# Patient Record
Sex: Female | Born: 1940 | Race: White | Hispanic: No | Marital: Married | Smoking: Never smoker
Health system: Southern US, Community
[De-identification: ages and names within clinical notes are randomized; demographics above are authoritative.]

## PROBLEM LIST (undated history)

## (undated) DIAGNOSIS — C50919 Malignant neoplasm of unspecified site of unspecified female breast: Secondary | ICD-10-CM

## (undated) DIAGNOSIS — I10 Essential (primary) hypertension: Secondary | ICD-10-CM

## (undated) DIAGNOSIS — M5126 Other intervertebral disc displacement, lumbar region: Secondary | ICD-10-CM

## (undated) DIAGNOSIS — M810 Age-related osteoporosis without current pathological fracture: Secondary | ICD-10-CM

## (undated) DIAGNOSIS — K8020 Calculus of gallbladder without cholecystitis without obstruction: Secondary | ICD-10-CM

## (undated) DIAGNOSIS — H269 Unspecified cataract: Secondary | ICD-10-CM

## (undated) DIAGNOSIS — T7840XA Allergy, unspecified, initial encounter: Secondary | ICD-10-CM

## (undated) DIAGNOSIS — L723 Sebaceous cyst: Secondary | ICD-10-CM

## (undated) DIAGNOSIS — G5601 Carpal tunnel syndrome, right upper limb: Secondary | ICD-10-CM

## (undated) DIAGNOSIS — K219 Gastro-esophageal reflux disease without esophagitis: Secondary | ICD-10-CM

## (undated) DIAGNOSIS — E785 Hyperlipidemia, unspecified: Secondary | ICD-10-CM

## (undated) HISTORY — DX: Other intervertebral disc displacement, lumbar region: M51.26

## (undated) HISTORY — DX: Hyperlipidemia, unspecified: E78.5

## (undated) HISTORY — DX: Malignant neoplasm of unspecified site of unspecified female breast: C50.919

## (undated) HISTORY — DX: Carpal tunnel syndrome, right upper limb: G56.01

## (undated) HISTORY — DX: Sebaceous cyst: L72.3

## (undated) HISTORY — DX: Calculus of gallbladder without cholecystitis without obstruction: K80.20

## (undated) HISTORY — DX: Unspecified cataract: H26.9

## (undated) HISTORY — DX: Essential (primary) hypertension: I10

## (undated) HISTORY — DX: Age-related osteoporosis without current pathological fracture: M81.0

## (undated) HISTORY — DX: Allergy, unspecified, initial encounter: T78.40XA

## (undated) HISTORY — PX: COLONOSCOPY: SHX174

## (undated) HISTORY — DX: Gastro-esophageal reflux disease without esophagitis: K21.9

---

## 1968-06-06 DIAGNOSIS — K8020 Calculus of gallbladder without cholecystitis without obstruction: Secondary | ICD-10-CM

## 1968-06-06 HISTORY — DX: Calculus of gallbladder without cholecystitis without obstruction: K80.20

## 1968-06-06 HISTORY — PX: CHOLECYSTECTOMY: SHX55

## 1968-06-06 HISTORY — PX: APPENDECTOMY: SHX54

## 1968-06-06 HISTORY — PX: TUBAL LIGATION: SHX77

## 1998-02-26 ENCOUNTER — Other Ambulatory Visit: Admission: RE | Admit: 1998-02-26 | Discharge: 1998-02-26 | Payer: Self-pay | Admitting: Obstetrics & Gynecology

## 1999-03-01 ENCOUNTER — Other Ambulatory Visit: Admission: RE | Admit: 1999-03-01 | Discharge: 1999-03-01 | Payer: Self-pay | Admitting: Obstetrics & Gynecology

## 2000-04-20 ENCOUNTER — Other Ambulatory Visit: Admission: RE | Admit: 2000-04-20 | Discharge: 2000-04-20 | Payer: Self-pay | Admitting: Obstetrics & Gynecology

## 2000-06-30 ENCOUNTER — Encounter (INDEPENDENT_AMBULATORY_CARE_PROVIDER_SITE_OTHER): Payer: Self-pay

## 2000-06-30 ENCOUNTER — Ambulatory Visit (HOSPITAL_COMMUNITY): Admission: RE | Admit: 2000-06-30 | Discharge: 2000-06-30 | Payer: Self-pay | Admitting: Obstetrics & Gynecology

## 2001-04-24 ENCOUNTER — Other Ambulatory Visit: Admission: RE | Admit: 2001-04-24 | Discharge: 2001-04-24 | Payer: Self-pay | Admitting: Obstetrics & Gynecology

## 2002-05-06 ENCOUNTER — Other Ambulatory Visit: Admission: RE | Admit: 2002-05-06 | Discharge: 2002-05-06 | Payer: Self-pay | Admitting: Obstetrics & Gynecology

## 2003-05-19 ENCOUNTER — Other Ambulatory Visit: Admission: RE | Admit: 2003-05-19 | Discharge: 2003-05-19 | Payer: Self-pay | Admitting: Obstetrics & Gynecology

## 2004-06-11 ENCOUNTER — Other Ambulatory Visit: Admission: RE | Admit: 2004-06-11 | Discharge: 2004-06-11 | Payer: Self-pay | Admitting: Obstetrics & Gynecology

## 2005-07-13 ENCOUNTER — Other Ambulatory Visit: Admission: RE | Admit: 2005-07-13 | Discharge: 2005-07-13 | Payer: Self-pay | Admitting: Obstetrics & Gynecology

## 2005-09-14 ENCOUNTER — Encounter: Admission: RE | Admit: 2005-09-14 | Discharge: 2005-09-14 | Payer: Self-pay | Admitting: *Deleted

## 2005-09-23 ENCOUNTER — Encounter: Admission: RE | Admit: 2005-09-23 | Discharge: 2005-09-23 | Payer: Self-pay | Admitting: General Surgery

## 2005-09-27 ENCOUNTER — Encounter (INDEPENDENT_AMBULATORY_CARE_PROVIDER_SITE_OTHER): Payer: Self-pay | Admitting: *Deleted

## 2005-09-27 ENCOUNTER — Ambulatory Visit (HOSPITAL_BASED_OUTPATIENT_CLINIC_OR_DEPARTMENT_OTHER): Admission: RE | Admit: 2005-09-27 | Discharge: 2005-09-27 | Payer: Self-pay | Admitting: General Surgery

## 2005-09-27 DIAGNOSIS — C50919 Malignant neoplasm of unspecified site of unspecified female breast: Secondary | ICD-10-CM

## 2005-09-27 HISTORY — DX: Malignant neoplasm of unspecified site of unspecified female breast: C50.919

## 2005-09-27 HISTORY — PX: BREAST LUMPECTOMY: SHX2

## 2005-10-03 ENCOUNTER — Ambulatory Visit: Payer: Self-pay | Admitting: Oncology

## 2005-10-05 LAB — COMPREHENSIVE METABOLIC PANEL
AST: 19 U/L (ref 0–37)
Alkaline Phosphatase: 53 U/L (ref 39–117)
Glucose, Bld: 84 mg/dL (ref 70–99)
Sodium: 135 mEq/L (ref 135–145)
Total Bilirubin: 0.8 mg/dL (ref 0.3–1.2)
Total Protein: 6.9 g/dL (ref 6.0–8.3)

## 2005-10-05 LAB — CBC WITH DIFFERENTIAL/PLATELET
Basophils Absolute: 0 10*3/uL (ref 0.0–0.1)
Eosinophils Absolute: 0.1 10*3/uL (ref 0.0–0.5)
HCT: 38 % (ref 34.8–46.6)
HGB: 13.3 g/dL (ref 11.6–15.9)
MONO#: 0.5 10*3/uL (ref 0.1–0.9)
NEUT#: 2.6 10*3/uL (ref 1.5–6.5)
NEUT%: 57.9 % (ref 39.6–76.8)
RDW: 12.5 % (ref 11.3–14.5)
lymph#: 1.3 10*3/uL (ref 0.9–3.3)

## 2005-10-05 LAB — CANCER ANTIGEN 27.29: CA 27.29: 27 U/mL (ref 0–39)

## 2005-10-10 ENCOUNTER — Encounter (INDEPENDENT_AMBULATORY_CARE_PROVIDER_SITE_OTHER): Payer: Self-pay | Admitting: Specialist

## 2005-10-10 ENCOUNTER — Ambulatory Visit (HOSPITAL_COMMUNITY): Admission: RE | Admit: 2005-10-10 | Discharge: 2005-10-10 | Payer: Self-pay | Admitting: General Surgery

## 2005-10-10 HISTORY — PX: BREAST MASS EXCISION: SHX1267

## 2005-10-17 ENCOUNTER — Ambulatory Visit: Admission: RE | Admit: 2005-10-17 | Discharge: 2005-12-29 | Payer: Self-pay | Admitting: *Deleted

## 2005-11-04 ENCOUNTER — Ambulatory Visit: Payer: Self-pay | Admitting: Oncology

## 2005-11-23 ENCOUNTER — Ambulatory Visit (HOSPITAL_COMMUNITY): Admission: RE | Admit: 2005-11-23 | Discharge: 2005-11-23 | Payer: Self-pay | Admitting: Oncology

## 2005-11-30 LAB — COMPREHENSIVE METABOLIC PANEL
Alkaline Phosphatase: 50 U/L (ref 39–117)
CO2: 28 mEq/L (ref 19–32)
Creatinine, Ser: 0.8 mg/dL (ref 0.40–1.20)
Glucose, Bld: 105 mg/dL — ABNORMAL HIGH (ref 70–99)
Sodium: 136 mEq/L (ref 135–145)
Total Bilirubin: 0.6 mg/dL (ref 0.3–1.2)

## 2005-11-30 LAB — CANCER ANTIGEN 27.29: CA 27.29: 29 U/mL (ref 0–39)

## 2005-11-30 LAB — CBC WITH DIFFERENTIAL/PLATELET
BASO%: 0.6 % (ref 0.0–2.0)
Eosinophils Absolute: 0.1 10*3/uL (ref 0.0–0.5)
HCT: 37 % (ref 34.8–46.6)
LYMPH%: 22.5 % (ref 14.0–48.0)
MCHC: 35 g/dL (ref 32.0–36.0)
MCV: 87.2 fL (ref 81.0–101.0)
MONO#: 0.3 10*3/uL (ref 0.1–0.9)
MONO%: 7.2 % (ref 0.0–13.0)
NEUT%: 68.4 % (ref 39.6–76.8)
Platelets: 265 10*3/uL (ref 145–400)
RBC: 4.24 10*6/uL (ref 3.70–5.32)
WBC: 4.7 10*3/uL (ref 3.9–10.0)

## 2005-11-30 LAB — LACTATE DEHYDROGENASE: LDH: 131 U/L (ref 94–250)

## 2006-03-13 ENCOUNTER — Ambulatory Visit: Payer: Self-pay | Admitting: Oncology

## 2006-03-15 LAB — CBC WITH DIFFERENTIAL/PLATELET
Basophils Absolute: 0 10*3/uL (ref 0.0–0.1)
Eosinophils Absolute: 0 10*3/uL (ref 0.0–0.5)
HGB: 12.8 g/dL (ref 11.6–15.9)
MCV: 88.9 fL (ref 81.0–101.0)
MONO#: 0.4 10*3/uL (ref 0.1–0.9)
MONO%: 6.8 % (ref 0.0–13.0)
NEUT#: 4.3 10*3/uL (ref 1.5–6.5)
RDW: 12.6 % (ref 11.3–14.5)
WBC: 5.9 10*3/uL (ref 3.9–10.0)

## 2006-03-15 LAB — COMPREHENSIVE METABOLIC PANEL
Albumin: 4.1 g/dL (ref 3.5–5.2)
Alkaline Phosphatase: 50 U/L (ref 39–117)
BUN: 13 mg/dL (ref 6–23)
CO2: 26 mEq/L (ref 19–32)
Calcium: 8.9 mg/dL (ref 8.4–10.5)
Chloride: 103 mEq/L (ref 96–112)
Glucose, Bld: 87 mg/dL (ref 70–99)
Potassium: 3.9 mEq/L (ref 3.5–5.3)
Total Protein: 6.9 g/dL (ref 6.0–8.3)

## 2006-03-15 LAB — CANCER ANTIGEN 27.29: CA 27.29: 26 U/mL (ref 0–39)

## 2006-06-06 DIAGNOSIS — M51369 Other intervertebral disc degeneration, lumbar region without mention of lumbar back pain or lower extremity pain: Secondary | ICD-10-CM

## 2006-06-06 DIAGNOSIS — M5126 Other intervertebral disc displacement, lumbar region: Secondary | ICD-10-CM

## 2006-06-06 HISTORY — DX: Other intervertebral disc degeneration, lumbar region without mention of lumbar back pain or lower extremity pain: M51.369

## 2006-06-06 HISTORY — DX: Other intervertebral disc displacement, lumbar region: M51.26

## 2006-07-07 ENCOUNTER — Ambulatory Visit: Payer: Self-pay | Admitting: Oncology

## 2006-07-12 LAB — CBC WITH DIFFERENTIAL/PLATELET
BASO%: 0.3 % (ref 0.0–2.0)
Eosinophils Absolute: 0.1 10*3/uL (ref 0.0–0.5)
HCT: 36.3 % (ref 34.8–46.6)
LYMPH%: 24.7 % (ref 14.0–48.0)
MCHC: 35.6 g/dL (ref 32.0–36.0)
MONO#: 0.4 10*3/uL (ref 0.1–0.9)
NEUT%: 66.1 % (ref 39.6–76.8)
Platelets: 226 10*3/uL (ref 145–400)
WBC: 5.2 10*3/uL (ref 3.9–10.0)

## 2006-07-12 LAB — COMPREHENSIVE METABOLIC PANEL
ALT: 9 U/L (ref 0–35)
AST: 17 U/L (ref 0–37)
CO2: 27 mEq/L (ref 19–32)
Calcium: 8.7 mg/dL (ref 8.4–10.5)
Chloride: 103 mEq/L (ref 96–112)
Sodium: 138 mEq/L (ref 135–145)
Total Bilirubin: 0.6 mg/dL (ref 0.3–1.2)
Total Protein: 6.7 g/dL (ref 6.0–8.3)

## 2006-07-12 LAB — LACTATE DEHYDROGENASE: LDH: 140 U/L (ref 94–250)

## 2006-09-20 ENCOUNTER — Ambulatory Visit: Payer: Self-pay | Admitting: Internal Medicine

## 2006-09-25 ENCOUNTER — Ambulatory Visit: Payer: Self-pay | Admitting: Internal Medicine

## 2006-09-27 ENCOUNTER — Ambulatory Visit: Payer: Self-pay | Admitting: Internal Medicine

## 2006-11-03 ENCOUNTER — Ambulatory Visit: Payer: Self-pay | Admitting: Internal Medicine

## 2006-11-07 ENCOUNTER — Ambulatory Visit: Payer: Self-pay | Admitting: Oncology

## 2006-11-09 LAB — COMPREHENSIVE METABOLIC PANEL
ALT: 13 U/L (ref 0–35)
AST: 20 U/L (ref 0–37)
Alkaline Phosphatase: 54 U/L (ref 39–117)
BUN: 14 mg/dL (ref 6–23)
Calcium: 8.9 mg/dL (ref 8.4–10.5)
Chloride: 99 mEq/L (ref 96–112)
Creatinine, Ser: 0.66 mg/dL (ref 0.40–1.20)
Total Bilirubin: 0.7 mg/dL (ref 0.3–1.2)

## 2006-11-09 LAB — CBC WITH DIFFERENTIAL/PLATELET
BASO%: 0.4 % (ref 0.0–2.0)
EOS%: 1.2 % (ref 0.0–7.0)
HCT: 38 % (ref 34.8–46.6)
MCH: 30.9 pg (ref 26.0–34.0)
MCHC: 35.7 g/dL (ref 32.0–36.0)
MCV: 86.4 fL (ref 81.0–101.0)
MONO%: 8.6 % (ref 0.0–13.0)
NEUT%: 64.5 % (ref 39.6–76.8)
lymph#: 1 10*3/uL (ref 0.9–3.3)

## 2007-03-15 ENCOUNTER — Ambulatory Visit: Payer: Self-pay | Admitting: Oncology

## 2007-03-19 LAB — CBC WITH DIFFERENTIAL/PLATELET
BASO%: 0.3 % (ref 0.0–2.0)
EOS%: 0.9 % (ref 0.0–7.0)
HCT: 35.1 % (ref 34.8–46.6)
LYMPH%: 34.4 % (ref 14.0–48.0)
MCH: 31 pg (ref 26.0–34.0)
MCHC: 35.2 g/dL (ref 32.0–36.0)
MCV: 88.1 fL (ref 81.0–101.0)
MONO#: 0.5 10*3/uL (ref 0.1–0.9)
NEUT%: 57 % (ref 39.6–76.8)
Platelets: 285 10*3/uL (ref 145–400)

## 2007-03-19 LAB — COMPREHENSIVE METABOLIC PANEL
ALT: 16 U/L (ref 0–35)
CO2: 26 mEq/L (ref 19–32)
Creatinine, Ser: 0.72 mg/dL (ref 0.40–1.20)
Total Bilirubin: 0.4 mg/dL (ref 0.3–1.2)

## 2007-03-19 LAB — CANCER ANTIGEN 27.29: CA 27.29: 28 U/mL (ref 0–39)

## 2007-03-28 LAB — ESTRADIOL, ULTRA SENS: Estradiol, Ultra Sensitive: <2 pg/mL

## 2007-03-30 ENCOUNTER — Encounter: Admission: RE | Admit: 2007-03-30 | Discharge: 2007-03-30 | Payer: Self-pay | Admitting: Family Medicine

## 2007-09-12 ENCOUNTER — Ambulatory Visit: Payer: Self-pay | Admitting: Oncology

## 2007-09-17 LAB — CBC WITH DIFFERENTIAL/PLATELET
Basophils Absolute: 0 10*3/uL (ref 0.0–0.1)
Eosinophils Absolute: 0 10*3/uL (ref 0.0–0.5)
HCT: 36.1 % (ref 34.8–46.6)
LYMPH%: 26.8 % (ref 14.0–48.0)
MONO#: 0.3 10*3/uL (ref 0.1–0.9)
NEUT#: 3.7 10*3/uL (ref 1.5–6.5)
NEUT%: 66.3 % (ref 39.6–76.8)
Platelets: 248 10*3/uL (ref 145–400)
WBC: 5.6 10*3/uL (ref 3.9–10.0)

## 2007-09-18 LAB — VITAMIN D 25 HYDROXY (VIT D DEFICIENCY, FRACTURES): Vit D, 25-Hydroxy: 52 ng/mL (ref 30–89)

## 2007-09-18 LAB — COMPREHENSIVE METABOLIC PANEL
CO2: 25 mEq/L (ref 19–32)
Creatinine, Ser: 0.75 mg/dL (ref 0.40–1.20)
Glucose, Bld: 108 mg/dL — ABNORMAL HIGH (ref 70–99)
Total Bilirubin: 0.4 mg/dL (ref 0.3–1.2)

## 2007-09-18 LAB — CANCER ANTIGEN 27.29: CA 27.29: 31 U/mL (ref 0–39)

## 2007-09-18 LAB — LACTATE DEHYDROGENASE: LDH: 145 U/L (ref 94–250)

## 2007-09-20 ENCOUNTER — Encounter: Admission: RE | Admit: 2007-09-20 | Discharge: 2007-09-20 | Payer: Self-pay | Admitting: Oncology

## 2007-10-02 ENCOUNTER — Encounter: Admission: RE | Admit: 2007-10-02 | Discharge: 2007-10-02 | Payer: Self-pay | Admitting: Neurosurgery

## 2007-11-05 DIAGNOSIS — Z853 Personal history of malignant neoplasm of breast: Secondary | ICD-10-CM

## 2008-03-21 ENCOUNTER — Ambulatory Visit: Payer: Self-pay | Admitting: Oncology

## 2008-03-25 LAB — CBC WITH DIFFERENTIAL/PLATELET
Basophils Absolute: 0 10*3/uL (ref 0.0–0.1)
EOS%: 1.5 % (ref 0.0–7.0)
HCT: 39 % (ref 34.8–46.6)
HGB: 13.7 g/dL (ref 11.6–15.9)
LYMPH%: 22.7 % (ref 14.0–48.0)
MCH: 30.9 pg (ref 26.0–34.0)
MCV: 87.8 fL (ref 81.0–101.0)
MONO%: 6.6 % (ref 0.0–13.0)
NEUT%: 68.8 % (ref 39.6–76.8)
Platelets: 279 10*3/uL (ref 145–400)

## 2008-03-26 LAB — COMPREHENSIVE METABOLIC PANEL
AST: 19 U/L (ref 0–37)
Alkaline Phosphatase: 73 U/L (ref 39–117)
BUN: 14 mg/dL (ref 6–23)
Calcium: 9.7 mg/dL (ref 8.4–10.5)
Creatinine, Ser: 0.75 mg/dL (ref 0.40–1.20)

## 2008-06-06 HISTORY — PX: CARPAL TUNNEL RELEASE: SHX101

## 2008-07-30 ENCOUNTER — Ambulatory Visit (HOSPITAL_COMMUNITY): Admission: RE | Admit: 2008-07-30 | Discharge: 2008-07-30 | Payer: Self-pay | Admitting: Neurosurgery

## 2008-10-01 ENCOUNTER — Ambulatory Visit: Payer: Self-pay | Admitting: Oncology

## 2008-10-03 LAB — CBC WITH DIFFERENTIAL/PLATELET
BASO%: 0.6 % (ref 0.0–2.0)
EOS%: 1.6 % (ref 0.0–7.0)
Eosinophils Absolute: 0.1 10*3/uL (ref 0.0–0.5)
MCH: 30.1 pg (ref 25.1–34.0)
MCV: 86.1 fL (ref 79.5–101.0)
MONO%: 6.4 % (ref 0.0–14.0)
NEUT#: 3.4 10*3/uL (ref 1.5–6.5)
RBC: 4.38 10*6/uL (ref 3.70–5.45)
RDW: 13.3 % (ref 11.2–14.5)

## 2008-10-06 LAB — COMPREHENSIVE METABOLIC PANEL
AST: 20 U/L (ref 0–37)
Albumin: 4.3 g/dL (ref 3.5–5.2)
Alkaline Phosphatase: 83 U/L (ref 39–117)
Potassium: 4.1 mEq/L (ref 3.5–5.3)
Sodium: 138 mEq/L (ref 135–145)
Total Protein: 6.9 g/dL (ref 6.0–8.3)

## 2008-10-06 LAB — CANCER ANTIGEN 27.29: CA 27.29: 38 U/mL (ref 0–39)

## 2009-04-15 ENCOUNTER — Ambulatory Visit: Payer: Self-pay | Admitting: Oncology

## 2009-04-17 LAB — CBC WITH DIFFERENTIAL/PLATELET
Basophils Absolute: 0 10*3/uL (ref 0.0–0.1)
EOS%: 2 % (ref 0.0–7.0)
Eosinophils Absolute: 0.1 10*3/uL (ref 0.0–0.5)
HCT: 38.8 % (ref 34.8–46.6)
MONO%: 5.3 % (ref 0.0–14.0)
NEUT#: 4.8 10*3/uL (ref 1.5–6.5)
Platelets: 269 10*3/uL (ref 145–400)
RBC: 4.4 10*6/uL (ref 3.70–5.45)
RDW: 12.7 % (ref 11.2–14.5)
WBC: 6.6 10*3/uL (ref 3.9–10.3)

## 2009-04-18 LAB — COMPREHENSIVE METABOLIC PANEL
AST: 20 U/L (ref 0–37)
BUN: 17 mg/dL (ref 6–23)
CO2: 22 mEq/L (ref 19–32)
Chloride: 104 mEq/L (ref 96–112)
Creatinine, Ser: 1.18 mg/dL (ref 0.40–1.20)
Potassium: 4.1 mEq/L (ref 3.5–5.3)
Sodium: 138 mEq/L (ref 135–145)
Total Protein: 6.9 g/dL (ref 6.0–8.3)

## 2009-04-18 LAB — LACTATE DEHYDROGENASE: LDH: 175 U/L (ref 94–250)

## 2009-04-18 LAB — CANCER ANTIGEN 27.29: CA 27.29: 38 U/mL (ref 0–39)

## 2009-06-06 DIAGNOSIS — L723 Sebaceous cyst: Secondary | ICD-10-CM

## 2009-06-06 HISTORY — DX: Sebaceous cyst: L72.3

## 2009-06-26 ENCOUNTER — Ambulatory Visit: Payer: Self-pay | Admitting: Oncology

## 2009-10-12 ENCOUNTER — Ambulatory Visit: Payer: Self-pay | Admitting: Oncology

## 2009-10-13 LAB — CBC WITH DIFFERENTIAL/PLATELET
EOS%: 2.6 % (ref 0.0–7.0)
HCT: 37.5 % (ref 34.8–46.6)
HGB: 13.3 g/dL (ref 11.6–15.9)
MCV: 86.3 fL (ref 79.5–101.0)
MONO#: 0.4 10*3/uL (ref 0.1–0.9)
NEUT#: 4.3 10*3/uL (ref 1.5–6.5)
Platelets: 257 10*3/uL (ref 145–400)
RBC: 4.34 10*6/uL (ref 3.70–5.45)
lymph#: 1.5 10*3/uL (ref 0.9–3.3)

## 2009-10-13 LAB — COMPREHENSIVE METABOLIC PANEL
AST: 18 U/L (ref 0–37)
CO2: 22 mEq/L (ref 19–32)
Calcium: 9.1 mg/dL (ref 8.4–10.5)
Chloride: 102 mEq/L (ref 96–112)
Creatinine, Ser: 0.85 mg/dL (ref 0.40–1.20)
Potassium: 4.2 mEq/L (ref 3.5–5.3)
Sodium: 138 mEq/L (ref 135–145)
Total Bilirubin: 0.5 mg/dL (ref 0.3–1.2)

## 2009-10-13 LAB — CANCER ANTIGEN 27.29: CA 27.29: 38 U/mL (ref 0–39)

## 2009-10-13 LAB — VITAMIN D 25 HYDROXY (VIT D DEFICIENCY, FRACTURES): Vit D, 25-Hydroxy: 67 ng/mL (ref 30–89)

## 2010-04-16 ENCOUNTER — Ambulatory Visit: Payer: Self-pay | Admitting: Oncology

## 2010-04-20 LAB — CBC WITH DIFFERENTIAL/PLATELET
Basophils Absolute: 0 10*3/uL (ref 0.0–0.1)
Eosinophils Absolute: 0.1 10*3/uL (ref 0.0–0.5)
HCT: 38.6 % (ref 34.8–46.6)
HGB: 13.5 g/dL (ref 11.6–15.9)
MCH: 30.5 pg (ref 25.1–34.0)
MONO#: 0.4 10*3/uL (ref 0.1–0.9)
MONO%: 6 % (ref 0.0–14.0)
NEUT%: 72.3 % (ref 38.4–76.8)
Platelets: 284 10*3/uL (ref 145–400)
RBC: 4.44 10*6/uL (ref 3.70–5.45)
RDW: 12.7 % (ref 11.2–14.5)
lymph#: 1.3 10*3/uL (ref 0.9–3.3)

## 2010-04-20 LAB — LACTATE DEHYDROGENASE: LDH: 179 U/L (ref 94–250)

## 2010-04-20 LAB — COMPREHENSIVE METABOLIC PANEL
ALT: 14 U/L (ref 0–35)
AST: 19 U/L (ref 0–37)
Albumin: 4.4 g/dL (ref 3.5–5.2)
Alkaline Phosphatase: 116 U/L (ref 39–117)
Total Bilirubin: 0.5 mg/dL (ref 0.3–1.2)
Total Protein: 7 g/dL (ref 6.0–8.3)

## 2010-04-20 LAB — CANCER ANTIGEN 27.29: CA 27.29: 44 U/mL — ABNORMAL HIGH (ref 0–39)

## 2010-04-20 LAB — VITAMIN D 25 HYDROXY (VIT D DEFICIENCY, FRACTURES): Vit D, 25-Hydroxy: 68 ng/mL (ref 30–89)

## 2010-06-27 ENCOUNTER — Encounter: Payer: Self-pay | Admitting: Oncology

## 2010-09-21 LAB — CBC
HCT: 39.3 % (ref 36.0–46.0)
Hemoglobin: 14.1 g/dL (ref 12.0–15.0)
MCHC: 35.8 g/dL (ref 30.0–36.0)
MCV: 86.4 fL (ref 78.0–100.0)
Platelets: 265 10*3/uL (ref 150–400)
RBC: 4.55 MIL/uL (ref 3.87–5.11)
RDW: 12.9 % (ref 11.5–15.5)
WBC: 5.8 10*3/uL (ref 4.0–10.5)

## 2010-10-19 ENCOUNTER — Other Ambulatory Visit: Payer: Self-pay | Admitting: Oncology

## 2010-10-19 ENCOUNTER — Encounter (HOSPITAL_BASED_OUTPATIENT_CLINIC_OR_DEPARTMENT_OTHER): Payer: Medicare Other | Admitting: Oncology

## 2010-10-19 DIAGNOSIS — C50319 Malignant neoplasm of lower-inner quadrant of unspecified female breast: Secondary | ICD-10-CM

## 2010-10-19 DIAGNOSIS — Z170 Estrogen receptor positive status [ER+]: Secondary | ICD-10-CM

## 2010-10-19 LAB — CBC WITH DIFFERENTIAL/PLATELET
BASO%: 0.4 % (ref 0.0–2.0)
Basophils Absolute: 0 10*3/uL (ref 0.0–0.1)
EOS%: 1.4 % (ref 0.0–7.0)
Eosinophils Absolute: 0.1 10*3/uL (ref 0.0–0.5)
HCT: 39 % (ref 34.8–46.6)
LYMPH%: 23.3 % (ref 14.0–49.7)
MCV: 86.6 fL (ref 79.5–101.0)
MONO%: 6.8 % (ref 0.0–14.0)
NEUT#: 4.2 10*3/uL (ref 1.5–6.5)
NEUT%: 68.1 % (ref 38.4–76.8)
RBC: 4.5 10*6/uL (ref 3.70–5.45)
RDW: 12.9 % (ref 11.2–14.5)
lymph#: 1.4 10*3/uL (ref 0.9–3.3)

## 2010-10-19 LAB — COMPREHENSIVE METABOLIC PANEL
ALT: 12 U/L (ref 0–35)
BUN: 22 mg/dL (ref 6–23)
Calcium: 9.8 mg/dL (ref 8.4–10.5)
Chloride: 101 mEq/L (ref 96–112)

## 2010-10-19 LAB — VITAMIN D 25 HYDROXY (VIT D DEFICIENCY, FRACTURES): Vit D, 25-Hydroxy: 71 ng/mL (ref 30–89)

## 2010-10-19 NOTE — Assessment & Plan Note (Signed)
[facility]                         GASTROENTEROLOGY OFFICE NOTE   NAME:FIELDSMonice, Lundy                       MRN:          [id number]  DATE:11/03/2006                            DOB:          [birth year]    Ms. Roache is a 70 year old, white female with dyspepsia and irritable  bowel syndrome. Her recent upper abdominal ultrasound was negative. Her  colonoscopy on September 25, 2006 showed mild diverticulosis of the left  colon, no polyps. She has complained of constipation in spite of taking  stool softener and fiber. Sublingual Levsin seemed to help to relieve  her symptoms but it has not been approved by her insurance. She has been  off Actonel but has really not noticed any significant difference in her  GI symptoms. Also Nexium which she takes in place Omeprazole did not  seem to make any difference in terms of her digestive symptoms.   PHYSICAL EXAMINATION:  VITAL SIGNS:  Blood pressure 124/64, pulse 60 and  weight 127 pounds.  GENERAL:  She was alert and oriented in no distress.   The patient was not examined today. We just discussed her irritable  bowel syndrome symptoms. She has predominant constipation and dyspepsia.   PLAN:  1. Begin MiraLax generic 17 grams daily. She may adjust the dose to      titrate her bowel movements.  2. Resume Actonel.  3. Switch back to omeprazole generic 20 mg p.o. daily, prescription      given.  4. Switch from Levsin to generic long acting antispasmodic Levbid      0.375 mg twice a day. She is to continue on Activia yogurt at least      2 or 3 times a week. I will see her on a p.r.n. basis.     Hedwig Morton. Juanda Chance, MD  Electronically Signed    DMB/MedQ  DD: 11/03/2006  DT: 11/03/2006  Job #: [id number]   cc:   Windle Guard, M.D.  Pierce Crane, M.D.

## 2010-10-19 NOTE — Op Note (Signed)
Allison Wells, Allison Wells                ACCOUNT NO.:  [id number]   MEDICAL RECORD NO.:  [id number]          PATIENT TYPE:  AMB   LOCATION:  SDS                          FACILITY:  [facility]   PHYSICIAN:  Hewitt Shorts, M.D.DATE OF BIRTH:  02/15/1941   DATE OF PROCEDURE:  07/30/2008  DATE OF DISCHARGE:  07/30/2008                               OPERATIVE REPORT   PREOPERATIVE DIAGNOSIS:  Bilateral carpal tunnel syndrome.   POSTOPERATIVE DIAGNOSIS:  Bilateral carpal tunnel syndrome.   PROCEDURE:  Right carpal tunnel release.   SURGEON:  Hewitt Shorts, MD   ANESTHESIA:  Bier-block with intravenous sedation.   INDICATIONS:  The patient is a 70 year old woman with bilateral carpal  tunnel syndrome, right worse than left.  We are proceeding with a right  carpal tunnel release and once she is recovered from that, we will plan  on a left carpal tunnel release.   PROCEDURE:  The patient was brought to the operating room, a Bier-block  was administered to the right upper extremity by the Anesthesia Service  and then the right upper extremity was prepped with Betadine soap and  solution and draped in a sterile fashion.  An incision was made just  medial to the right thenar crease in the proximal palm.  Dissection was  carried down through the subcutaneous tissue to the tranverse carpal  ligament, which was carefully divided from distal to proximal taking  care to leave the underlying structures undisturbed.  The tranverse  carpal ligament was opened in its full extent from distal to proximal  and once that was completed, we proceeded with closure.  The  subcutaneous and subcuticular were closed with interrupted inverted 2-0  undyed Vicryl sutures and the skin was reapproximated with interrupted  horizontal mattress using 4-0 nylon.  The wound was dressed with Adaptic  and sterile gauze  and wrapped with a Kling and once the Kling was  applied, the tourniquet was released.  The patient was  subsequently  transferred to the recovery room for further care.   The patient is to have a dressing change by [facility] tomorrow and have her  sutures out in 2 weeks.  She is to call us if she has difficulty.  She  was given a prescription for Vicodin 1 or 2 q.i.d. p.r.n. pain, 25  tablets prescribed, no refills.  She was given a sling to use to keep  the right upper extremity elevated, and she has been instructed in  gentle exercises for the flexibility and mobility of her digits.  Estimated blood loss was nil.  Sponge and needle count were correct.      Hewitt Shorts, M.D.  Electronically Signed     RWN/MEDQ  D:  07/30/2008  T:  07/31/2008  Job:  [id number]

## 2010-10-26 ENCOUNTER — Encounter: Payer: Medicare Other | Admitting: Oncology

## 2011-10-19 ENCOUNTER — Other Ambulatory Visit: Payer: Self-pay | Admitting: Physician Assistant

## 2011-10-19 ENCOUNTER — Other Ambulatory Visit (HOSPITAL_BASED_OUTPATIENT_CLINIC_OR_DEPARTMENT_OTHER): Payer: Medicare Other | Admitting: Lab

## 2011-10-19 DIAGNOSIS — Z853 Personal history of malignant neoplasm of breast: Secondary | ICD-10-CM

## 2011-10-19 LAB — CBC WITH DIFFERENTIAL/PLATELET
BASO%: 0.5 % (ref 0.0–2.0)
Basophils Absolute: 0 10*3/uL (ref 0.0–0.1)
EOS%: 2.9 % (ref 0.0–7.0)
HCT: 40.3 % (ref 34.8–46.6)
HGB: 13.8 g/dL (ref 11.6–15.9)
LYMPH%: 25.9 % (ref 14.0–49.7)
MCH: 29.7 pg (ref 25.1–34.0)
MCHC: 34.2 g/dL (ref 31.5–36.0)
MCV: 86.8 fL (ref 79.5–101.0)
NEUT%: 63.2 % (ref 38.4–76.8)
Platelets: 265 10*3/uL (ref 145–400)

## 2011-10-19 LAB — COMPREHENSIVE METABOLIC PANEL
AST: 21 U/L (ref 0–37)
Albumin: 4.4 g/dL (ref 3.5–5.2)
Alkaline Phosphatase: 108 U/L (ref 39–117)
BUN: 25 mg/dL — ABNORMAL HIGH (ref 6–23)
Calcium: 9.4 mg/dL (ref 8.4–10.5)
Creatinine, Ser: 0.75 mg/dL (ref 0.50–1.10)
Glucose, Bld: 79 mg/dL (ref 70–99)
Potassium: 4.3 mEq/L (ref 3.5–5.3)

## 2011-10-19 LAB — CANCER ANTIGEN 27.29: CA 27.29: 37 U/mL (ref 0–39)

## 2011-10-26 ENCOUNTER — Ambulatory Visit (HOSPITAL_BASED_OUTPATIENT_CLINIC_OR_DEPARTMENT_OTHER): Payer: Medicare Other | Admitting: Oncology

## 2011-10-26 ENCOUNTER — Telehealth: Payer: Self-pay | Admitting: *Deleted

## 2011-10-26 VITALS — BP 171/70 | HR 60 | Temp 98.2°F | Ht 62.0 in | Wt 145.0 lb

## 2011-10-26 DIAGNOSIS — Z853 Personal history of malignant neoplasm of breast: Secondary | ICD-10-CM

## 2011-10-26 DIAGNOSIS — Z170 Estrogen receptor positive status [ER+]: Secondary | ICD-10-CM

## 2011-10-26 DIAGNOSIS — M899 Disorder of bone, unspecified: Secondary | ICD-10-CM

## 2011-10-26 NOTE — Telephone Encounter (Signed)
gave patient appointment for 10-2012 printed out calendar and gave to the patient 

## 2011-10-26 NOTE — Progress Notes (Signed)
Hematology and Oncology Follow Up Visit  Allison Wells [id number] 1940-07-11 71 y.o. 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 N0 ER/PR positive breast cancer status post lumpectomy April 07 followed by radiation therapy completed July 07 followed by 6 years of adjuvant hormonal therapy discontinued in 2012.  No diagnosis found.   PAST THERAPY: As above  Interim History:  Patient is doing well. She has been seen by her gynecologist who is recommended as taking Vagifem 10 mcg daily for vaginal dryness. She does have some vaginal dryness and some slight urinary incontinence as well as some dyspareunia. I have recommended Replens and/or gastric line as well as possibly using vitamin E as a vaginal suppository. As far as the Vagifem is concerned. She could use it but certainly would decrease the frequency of its use when she had its desired effect. She has had any recent bone density test done in April as well as bilateral mammograms.  Medications: I have reviewed the patient's current medications.  Allergies: Allergies not on file  Past Medical History, Surgical history, Social history, and Family History were reviewed and updated.  Review of Systems: Constitutional:  Negative for fever, chills, night sweats, anorexia, weight loss, pain. Cardiovascular: no chest pain or dyspnea on exertion Respiratory: no cough, shortness of breath, or wheezing Neurological: negative Dermatological: negative ENT: negative Skin Gastrointestinal: negative Genito-Urinary: negative Hematological and Lymphatic: negative Breast: negative Musculoskeletal: negative Remaining ROS negative.  Physical Exam:  Blood pressure 171/70, pulse 60, temperature 98.2 F (36.8 C), height 5\' 2"  (1.575 m), weight 145 lb (65.772 kg).  ECOG: 0   General appearance: alert, cooperative and appears stated age Ears: normal TM's and external ear canals both ears Throat: lips, mucosa, and tongue normal;  teeth and gums normal Resp: clear to auscultation bilaterally and normal percussion bilaterally Breasts: normal appearance, no masses or tenderness, Right and left breasts are free of any masses. The right breast is status post lumpectomy with associated deformity and seen in the medial portion of the breast. There are no masses. The axilla bilaterally negative. Cardio: regular rate and rhythm, S1, S2 normal, no murmur, click, rub or gallop and normal apical impulse GI: soft, non-tender; bowel sounds normal; no masses,  no organomegaly Extremities: extremities normal, atraumatic, no cyanosis or edema Pulses: 2+ and symmetric Lymph nodes: Cervical, supraclavicular, and axillary nodes normal. Neurologic: Grossly normal   Lab Results: Lab Results  Component Value Date   WBC 6.5 10/19/2011   HGB 13.8 10/19/2011   HCT 40.3 10/19/2011   MCV 86.8 10/19/2011   PLT 265 10/19/2011     Chemistry      Component Value Date/Time   NA 136 10/19/2011 1006   K 4.3 10/19/2011 1006   CL 101 10/19/2011 1006   CO2 27 10/19/2011 1006   BUN 25* 10/19/2011 1006   CREATININE 0.75 10/19/2011 1006      Component Value Date/Time   CALCIUM 9.4 10/19/2011 1006   ALKPHOS 108 10/19/2011 1006   AST 21 10/19/2011 1006   ALT 16 10/19/2011 1006   BILITOT 0.7 10/19/2011 1006       Radiological Studies:  No results found.   IMPRESSIONS AND PLAN: A 71 y.o. female with   History of node-negative ER/PR positive breast cancer status post lumpectomy radiation and adjuvant whole therapy. Her most recent bone density test that shows stable bone density with slight osteopenia. She will continue use your vitamin D. I will plan to see her  in a years time. I have given her instructions regarding her vaginal discomfort.  Spent more than half the time coordinating care.    Allison Wells 5/22/201312:04 PM

## 2012-03-26 ENCOUNTER — Other Ambulatory Visit: Payer: Self-pay | Admitting: Family Medicine

## 2012-03-26 DIAGNOSIS — R0602 Shortness of breath: Secondary | ICD-10-CM

## 2012-03-26 DIAGNOSIS — M545 Low back pain: Secondary | ICD-10-CM

## 2012-03-27 ENCOUNTER — Ambulatory Visit
Admission: RE | Admit: 2012-03-27 | Discharge: 2012-03-27 | Disposition: A | Discharge status: Home / Self Care | Payer: Medicare Other | Source: Ambulatory Visit | Attending: Family Medicine | Admitting: Family Medicine

## 2012-03-27 DIAGNOSIS — M545 Low back pain: Secondary | ICD-10-CM

## 2012-03-27 DIAGNOSIS — R0602 Shortness of breath: Secondary | ICD-10-CM

## 2012-08-20 ENCOUNTER — Telehealth: Payer: Self-pay | Admitting: *Deleted

## 2012-08-20 NOTE — Telephone Encounter (Signed)
Received a call from patient to reschedule her appt. Confirmed appt. For 10/25/12 at 0900.  Then will become Dr. Darnelle Catalan.

## 2012-10-25 ENCOUNTER — Other Ambulatory Visit: Payer: Medicare Other | Admitting: Lab

## 2012-10-25 ENCOUNTER — Encounter: Payer: Self-pay | Admitting: Family

## 2012-10-25 ENCOUNTER — Ambulatory Visit: Payer: Medicare Other | Admitting: Oncology

## 2012-10-25 ENCOUNTER — Ambulatory Visit (HOSPITAL_BASED_OUTPATIENT_CLINIC_OR_DEPARTMENT_OTHER): Payer: Medicare Other | Admitting: Family

## 2012-10-25 VITALS — BP 149/76 | HR 69 | Temp 97.9°F | Resp 20 | Ht 62.0 in | Wt 149.2 lb

## 2012-10-25 DIAGNOSIS — Z853 Personal history of malignant neoplasm of breast: Secondary | ICD-10-CM

## 2012-10-25 DIAGNOSIS — M949 Disorder of cartilage, unspecified: Secondary | ICD-10-CM

## 2012-10-25 NOTE — Patient Instructions (Addendum)
Please contact us at [phone] if you have any questions or concerns.  Please continue to do well and enjoy life!!!  Get plenty of rest, drink plenty of water, exercise daily, eat a balanced diet.  Vitamin D3 2000 IUs daily.   Complete monthly self-breast examinations.  Have a clinical breast exam by a physician every year  Have your mammogram completed every year.

## 2012-10-25 NOTE — Progress Notes (Signed)
[facility]  [phone] [phone]  OFFICE PROGRESS NOTE   ID: Allison Wells   DOB: 10/29/1940  MR#: [id number]  JYN#:[id number]   PCP: Allison Mask, MD GYN: Allison Baas, MD SU:  OTHER MD:   HISTORY OF PRESENT ILLNESS: From Allison Wells's patient evaluation note dated 10/05/2005: "This is a 72 year old woman referred by Allison Wells for evaluation and treatment of breast cancer.  This is a healthy woman who has had no real chronic medical problems.  She has undergone routine screening mammography on an annual basis.  In fact, she had a mammogram in December which was negative, subsequent physical examination in February which was negative, and then on March 31, she lay her hand down on her right breast and felt a thickening.  She was referred back.  The mammogram performed on 09/08/2005 specifically of the right breast showed an area of architectural distortion at 4 o'clock position.  This area was biopsied on 09/07/2005 which did in fact show invasive mammary carcinoma, low histological grade and ER/PR positive at 81% and 84% respectively, proliferative index 15%, HER-2 was 1+, FISH testing showed this to be ratio of 1.1, BSGI showed an area of isotope uptake measuring 0.9 x 1.2 cm in the same location.  An MRI scan performed 09/14/2005 showed a solitary 1.5 cm spiculated mass right breast at 4 o'clock position.  Allison Wells went to the OR on 09/27/2005 for a lumpectomy and sentinel node evaluation.  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  The tumor was transected at multiple focal areas at the medial margin.  No LVI was seen.  Patient is due to undergo reexcision."  INTERVAL HISTORY: Allison Wells and I saw Allison Wells today for follow up of right breast tubulobular carcinoma.  The patient was last seen by Allison Wells on 10/26/2011.  Since her last office visit, the patient has been doing relatively well.  She is establishing  herself with Allison Wells service today.  REVIEW OF SYSTEMS: A 10 point review of systems was completed and is negative except ongoing shortness of breath since radiation therapy in 2007. 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  The patient denies any other symptomatology.   PAST MEDICAL HISTORY: Past Medical History  Diagnosis Date  . Breast cancer 09/27/2005    Right breast  . Osteoporosis   . Hyperlipidemia   . Gallstones 1970  . Bulging lumbar disc 2008  . Carpal tunnel syndrome on right   . Sebaceous cyst 2011    PAST SURGICAL HISTORY: Past Surgical History  Procedure Laterality Date  . Cholecystectomy  1970  . Appendectomy  1970  . Tubal ligation  1970  . Breast lumpectomy Right 09/27/2005  . Breast mass excision Right 10/10/2005  . Carpal tunnel release  2010  Gallbladder surgery and appendectomy at the same time and a history of tubal ligation.  for FAMILY HISTORY Family History  Problem Relation Age of Onset  . Heart Problems Mother   . Alzheimer's disease Mother   . Stroke Father   . Emphysema Father   . Cancer Paternal Grandfather     Stomach cancer  . Cancer Cousin     Breast cancer - maternal and paternal cousins  Father and  mother are deceased.  There is no history of breast or ovarian cancer in the family.  She has no other siblings.  GYNECOLOGIC HISTORY: She is gravida 3 para 2, two adult children one son and one daughter.  Postmenopausal for over 15 years.  She had been on Prempro for 10 years and stopped in 09/2005.  SOCIAL HISTORY: Allison Wells has been married since 1964.  Her husband Allison Wells owns a [employer] and is a former Lexicographer.  Now he is a Museum/gallery exhibitions officer. The patient is a  retired Environmental health practitioner The patient and her husband have 2 adult children, a son who lives in [place], [place] and one daughter who lives in [place], [place].  They have 3 grandchildren.  In her spare time the patient enjoys socializing with her friends, spending time with her family, dancing to [employer], gardening and cooking.   ADVANCED DIRECTIVES: Not on file  HEALTH MAINTENANCE: History  Substance Use Topics  . Smoking status: Never Smoker   . Smokeless tobacco: Never Used  . Alcohol Use: 4.2 oz/week    7 Glasses of wine per week    Colonoscopy: 09/25/2006 PAP: Not on file Bone density:  The patient's last bone density scan on 09/26/2011 showed a T score of -2.3 (osteopenia). Lipid panel:  09/27/2012  No Known Allergies  Current Outpatient Prescriptions  Medication Sig Dispense Refill  . lovastatin (MEVACOR) 20 MG tablet Take 20 mg by mouth at bedtime.      . Omeprazole (PRILOSEC PO) Take 1 tablet by mouth as needed.      . polyethylene glycol powder (MIRALAX) powder Take 17 g by mouth daily.       No current facility-administered medications for this visit.    OBJECTIVE: Filed Vitals:   10/25/12 0920  BP: 149/76  Pulse: 69  Temp: 97.9 F (36.6 C)  Resp: 20     Body mass index is 27.28 kg/(m^2).      ECOG FS: 1 - Symptomatic but completely ambulatory  General appearance: Alert, cooperative, well nourished, no apparent distress Head: Normocephalic, without obvious abnormality, atraumatic Eyes: Arcus senilis, PERRLA, EOMI Nose: Nares, septum and mucosa are normal, no drainage or sinus tenderness Neck: No adenopathy, supple, symmetrical, trachea midline, thyroid not enlarged, no tenderness Resp: Clear to auscultation bilaterally Cardio: Regular rate and rhythm, S1, S2 normal, no murmur, click, rub or gallop Breasts: Right breast architectural changes, right breast has well-healed surgical scars, no lymphadenopathy, no nipple inversion,  bilateral axillary fullness GI: Soft, distended, non-tender, hypoactive bowel sounds, RLQ cyst/hernia Skin:  Seborrhea keratosis on trunk Extremities: Extremities normal, atraumatic, no cyanosis or edema Lymph nodes: Cervical, supraclavicular, and axillary nodes normal Neurologic: Grossly normal   LAB RESULTS: Lab Results  Component Value Date   WBC 6.5 10/19/2011   NEUTROABS 4.1 10/19/2011   HGB 13.8 10/19/2011   HCT 40.3 10/19/2011   MCV 86.8 10/19/2011   PLT 265 10/19/2011      Chemistry      Component Value Date/Time   NA 136 10/19/2011 1006   K 4.3 10/19/2011 1006   CL 101 10/19/2011 1006   CO2 27 10/19/2011 1006   BUN 25* 10/19/2011 1006   CREATININE 0.75 10/19/2011 1006      Component Value Date/Time   CALCIUM 9.4 10/19/2011 1006   ALKPHOS 108 10/19/2011 1006   AST 21 10/19/2011 1006   ALT 16 10/19/2011 1006   BILITOT 0.7 10/19/2011 1006  Lab Results  Component Value Date   LABCA2 37 10/19/2011   Patient provided a copy of her own lab results from [facility] dated 09/27/2012 which showed: glucose 93, BUN 18, creatinine 0.71, sodium 140, potassium 5.1, chloride 102, carbon dioxide 24, calcium 9.6, protein 6.8, albumin 4.4, globulin 2.4, total bilirubin 0.4, alkaline phosphatase 121, AST 21, ALT 18, total cholesterol 253, triglycerides 86, HDL 75, LDL 161, TSH 3.160, vitamin D 39.4.  Urinalysis No results found for this basename: colorurine,  appearanceur,  labspec,  phurine,  glucoseu,  hgbur,  bilirubinur,  ketonesur,  proteinur,  urobilinogen,  nitrite,  leukocytesur    STUDIES: No results found.  ASSESSMENT: 72 y.o. woman: 1.  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  2. Status post right breast subareolar tissue  excision which showed atypical lobular hyperplasia including ductal involvement by atypical lobular hyperplasia, inflammatory changes consistent with previous biopsy, medial breast excision which showed benign breast parenchyma with inflammatory changes and  superior excisional biopsy which showed benign breast parenchyma with fibrocystic changes and inflammatory changes, definitive invasive carcinoma was not identified.  3.  Status post radiation therapy from 11/01/2005 through 12/16/2005.  4.  The patient started antiestrogen therapy with tamoxifen in 12/2005 for 2 years followed by Femara and then changed to Arimidex.  Antiestrogen therapy concluded on 01/14/2011.  5.  The patient's last bilateral digital diagnostic mammogram on 09/26/2012 showed the breasts are heterogeneously dense which is an independent risk factor for breast cancer and which also decreases mammographic sensitivity.  The breast parenchymal pattern is stable with no new worrisome finding in either breast.  Benign findings.  6.  The patient's last bone density scan on 09/26/2011 showed a T score of -2.3(osteopenia).  PLAN: The patient is over 7  years from her time of diagnosis and will officially become a graduate of [employer]'s breast cancer program.  We asked that she continue annual clinical breast examinations by a physician in addition to annual mammography.  Her last mammogram results are listed above.  She is due for her annual mammogram in 09/2013. The patient was asked to increase her vitamin D3 supplementation to 2000 IUs PO daily.   All questions were answered.  The patient was encouraged to contact us with any problems, questions or concerns.   Larina Bras, NP-C 10/27/2012, 7:46 PM

## 2013-06-10 ENCOUNTER — Ambulatory Visit (HOSPITAL_COMMUNITY)
Admission: RE | Admit: 2013-06-10 | Discharge: 2013-06-10 | Disposition: A | Discharge status: Home / Self Care | Payer: Medicare Other | Source: Ambulatory Visit | Attending: Obstetrics & Gynecology | Admitting: Obstetrics & Gynecology

## 2013-06-10 ENCOUNTER — Other Ambulatory Visit (HOSPITAL_COMMUNITY): Payer: Self-pay | Admitting: Obstetrics & Gynecology

## 2013-06-10 DIAGNOSIS — C50919 Malignant neoplasm of unspecified site of unspecified female breast: Secondary | ICD-10-CM | POA: Insufficient documentation

## 2013-06-10 DIAGNOSIS — R0602 Shortness of breath: Secondary | ICD-10-CM

## 2013-07-04 ENCOUNTER — Other Ambulatory Visit (HOSPITAL_COMMUNITY): Payer: Self-pay | Admitting: Family Medicine

## 2013-07-04 ENCOUNTER — Encounter: Payer: Self-pay | Admitting: Cardiology

## 2013-07-04 DIAGNOSIS — R0600 Dyspnea, unspecified: Secondary | ICD-10-CM

## 2013-07-04 LAB — PULMONARY FUNCTION TEST
DL/VA % pred: 96 %
DL/VA: 4.37 ml/min/mmHg/L
DLCO COR: 19.83 ml/min/mmHg
DLCO cor % pred: 91 %
DLCO unc % pred: 91 %
DLCO unc: 19.83 ml/min/mmHg
FEF 25-75 PRE: 1.44 L/s
FEF 25-75 Post: 1.91 L/sec
FEF2575-%CHANGE-POST: 32 %
FEF2575-%PRED-POST: 115 %
FEF2575-%Pred-Pre: 86 %
FEV1-%CHANGE-POST: 6 %
FEV1-%PRED-POST: 114 %
FEV1-%Pred-Pre: 107 %
FEV1-Post: 2.29 L
FEV1-Pre: 2.16 L
FEV1FVC-%Change-Post: 6 %
FEV1FVC-%Pred-Pre: 95 %
FEV6-%Change-Post: -2 %
FEV6-%PRED-POST: 114 %
FEV6-%Pred-Pre: 117 %
FEV6-POST: 2.91 L
FEV6-PRE: 2.97 L
FEV6FVC-%CHANGE-POST: 0 %
FEV6FVC-%PRED-PRE: 104 %
FEV6FVC-%Pred-Post: 104 %
FVC-%Change-Post: 0 %
FVC-%PRED-PRE: 112 %
FVC-%Pred-Post: 112 %
FVC-POST: 2.99 L
FVC-PRE: 3 L
POST FEV1/FVC RATIO: 77 %
POST FEV6/FVC RATIO: 99 %
PRE FEV6/FVC RATIO: 99 %
Pre FEV1/FVC ratio: 72 %
RV % PRED: 89 %
RV: 1.92 L
TLC % PRED: 107 %
TLC: 5.08 L

## 2013-07-05 ENCOUNTER — Ambulatory Visit (HOSPITAL_COMMUNITY)
Admission: RE | Admit: 2013-07-05 | Discharge: 2013-07-05 | Disposition: A | Discharge status: Home / Self Care | Payer: Medicare Other | Source: Ambulatory Visit | Attending: Family Medicine | Admitting: Family Medicine

## 2013-07-05 DIAGNOSIS — R0602 Shortness of breath: Secondary | ICD-10-CM | POA: Insufficient documentation

## 2013-07-05 DIAGNOSIS — J988 Other specified respiratory disorders: Secondary | ICD-10-CM | POA: Insufficient documentation

## 2013-07-05 DIAGNOSIS — R0609 Other forms of dyspnea: Secondary | ICD-10-CM | POA: Insufficient documentation

## 2013-07-05 DIAGNOSIS — R0989 Other specified symptoms and signs involving the circulatory and respiratory systems: Secondary | ICD-10-CM | POA: Insufficient documentation

## 2013-07-05 MED ORDER — ALBUTEROL SULFATE (2.5 MG/3ML) 0.083% IN NEBU
2.5000 mg | INHALATION_SOLUTION | Freq: Once | RESPIRATORY_TRACT | Status: AC
  Administered 2013-07-05: 2.5 mg via RESPIRATORY_TRACT

## 2013-08-17 ENCOUNTER — Encounter: Payer: Self-pay | Admitting: Internal Medicine

## 2014-06-16 ENCOUNTER — Other Ambulatory Visit: Payer: Self-pay | Admitting: Obstetrics & Gynecology

## 2014-06-17 LAB — CYTOLOGY - PAP

## 2014-09-22 ENCOUNTER — Encounter: Payer: Self-pay | Admitting: Cardiology

## 2014-10-06 ENCOUNTER — Encounter: Payer: Self-pay | Admitting: Cardiology

## 2015-05-04 ENCOUNTER — Telehealth: Payer: Self-pay | Admitting: Cardiology

## 2015-05-04 NOTE — Telephone Encounter (Signed)
Records received from [facility] placed in chart prep bin for 07/06/15 appointment with Dr. Meda Coffee

## 2015-07-06 ENCOUNTER — Ambulatory Visit (INDEPENDENT_AMBULATORY_CARE_PROVIDER_SITE_OTHER): Payer: Medicare Other | Admitting: Cardiology

## 2015-07-06 ENCOUNTER — Encounter: Payer: Self-pay | Admitting: Cardiology

## 2015-07-06 VITALS — BP 134/80 | HR 60 | Ht 62.0 in | Wt 151.0 lb

## 2015-07-06 DIAGNOSIS — R0989 Other specified symptoms and signs involving the circulatory and respiratory systems: Secondary | ICD-10-CM | POA: Diagnosis not present

## 2015-07-06 DIAGNOSIS — Z5189 Encounter for other specified aftercare: Secondary | ICD-10-CM | POA: Diagnosis not present

## 2015-07-06 DIAGNOSIS — R0602 Shortness of breath: Secondary | ICD-10-CM | POA: Diagnosis not present

## 2015-07-06 DIAGNOSIS — E785 Hyperlipidemia, unspecified: Secondary | ICD-10-CM | POA: Diagnosis not present

## 2015-07-06 DIAGNOSIS — Z923 Personal history of irradiation: Secondary | ICD-10-CM

## 2015-07-06 NOTE — Patient Instructions (Signed)
Medication Instructions:  Same-no changes  Labwork: None  Testing/Procedures: All to be done ASAP Your physician has requested that you have a carotid duplex. This test is an ultrasound of the carotid arteries in your neck. It looks at blood flow through these arteries that supply the brain with blood. Allow one hour for this exam. There are no restrictions or special instructions.  Your physician has requested that you have en exercise stress myoview. For further information please visit [email or web address]. Please follow instruction sheet, as given.  Your physician has requested that you have an echocardiogram. Echocardiography is a painless test that uses sound waves to create images of your heart. It provides your doctor with information about the size and shape of your heart and how well your heart's chambers and valves are working. This procedure takes approximately one hour. There are no restrictions for this procedure.    Follow-Up: Your physician recommends that you schedule a follow-up appointment in: 6 weeks      If you need a refill on your cardiac medications before your next appointment, please call your pharmacy.

## 2015-07-06 NOTE — Progress Notes (Signed)
Patient ID: Allison Wells, female   DOB: Aug 21, 1940, 76 y.o.   MRN: [id number]      Cardiology Office Note   Date:  07/06/2015   ID:  Allison Wells, DOB [birth year], MRN [id number]  PCP:  Leonard Downing, MD  Cardiologist:   Dorothy Spark, MD   Chief complaint: Shortness of breath, palpitations.  History of Present Illness: Allison Wells is a 75 y.o. female who presents for evaluation of dyspnea on exertion. This is a very pleasant young-appearing 75 year old female who is a wife of my patient. The patient has no prior known history of heart problem. She was treated with 30 radiation therapy is an anastrozole for breast cancer 8 years ago. She has noticed shortness of breath since then but it has been worsening over the last year. On moderate exertion she also has noticed pressure in her chest. She denies any syncope. She feels occasional palpitations that are not associated with dizziness or syncope. In her family her mother had myocardial infarction in her 74s, father died of stroke, and grandfather had heart attack in his 10s. She eats fairly healthy and stays active but is not on a regular exercise program. She is currently using lovastatin for a straw management. Her cholesterol in May 2016 was triglycerides 107, HDL 73 and LDL 117.  Past Medical History  Diagnosis Date  . Breast cancer ([place]) 09/27/2005    Right breast  . Osteoporosis   . Hyperlipidemia   . Gallstones 1970  . Bulging lumbar disc 2008  . Carpal tunnel syndrome on right   . Sebaceous cyst 2011   Past Surgical History  Procedure Laterality Date  . Cholecystectomy  1970  . Appendectomy  1970  . Tubal ligation  1970  . Breast lumpectomy Right 09/27/2005  . Breast mass excision Right 10/10/2005  . Carpal tunnel release  2010   Current Outpatient Prescriptions  Medication Sig Dispense Refill  . cetirizine (ZYRTEC) 10 MG tablet Take 10 mg by mouth daily as needed for allergies.    Marland Kitchen FLUoxetine (PROZAC) 20 MG  capsule Take 20 mg by mouth daily.    Marland Kitchen lovastatin (MEVACOR) 20 MG tablet Take 20 mg by mouth at bedtime.    . polyethylene glycol powder (MIRALAX) powder Take 17 g by mouth daily.    . ranitidine (ZANTAC) 150 MG tablet Take 150-300 mg by mouth 2 (two) times daily as needed for heartburn.     No current facility-administered medications for this visit.   Allergies:   Review of patient's allergies indicates no known allergies.   Social History:  The patient  reports that she has never smoked. She has never used smokeless tobacco. She reports that she drinks about 4.2 oz of alcohol per week. She reports that she does not use illicit drugs.   Family History:  The patient's family history includes Alzheimer's disease in her mother; Cancer in her cousin and paternal grandfather; Emphysema in her father; Heart Problems in her mother; Stroke in her father.   ROS:  Please see the history of present illness.   Otherwise, review of systems are positive for none.   All other systems are reviewed and negative.   PHYSICAL EXAM: VS:  BP 134/80 mmHg  Pulse 60  Ht 5\' 2"  (1.575 m)  Wt 151 lb (68.493 kg)  BMI 27.61 kg/m2 , BMI Body mass index is 27.61 kg/(m^2). GEN: Well nourished, well developed, in no acute distress HEENT: normal Neck: no JVD, positive for  left carotid bruits, or masses Cardiac: RRR; no murmurs, rubs, or gallops,no edema  Respiratory:  clear to auscultation bilaterally, normal work of breathing GI: soft, nontender, nondistended, + BS MS: no deformity or atrophy Skin: warm and dry, no rash Neuro:  Strength and sensation are intact Psych: euthymic mood, full affect   EKG:  EKG is not ordered today. EKG strips from her primary care physician showed normal sinus rhythm.   Recent Labs: No results found for requested labs within last 365 days. as per history of present illness  Lipid Panel No results found for: CHOL, TRIG, HDL, CHOLHDL, VLDL, LDLCALC, LDLDIRECT as per history of  present illness   Wt Readings from Last 3 Encounters:  07/06/15 151 lb (68.493 kg)  10/25/12 149 lb 3.2 oz (67.677 kg)  10/26/11 145 lb (65.772 kg)      ASSESSMENT AND PLAN:  75 year old female with worsening dyspnea on exertion and prior intense radiation to her chest.  1. Shortness of breath and dyspnea on exertion, we will order a baseline echocardiogram to evaluate for systolic and diastolic function in the settings of prior radiation and chemotherapy. We will also order an exercise nuclear stress test to evaluate for ischemia. We will also evaluate patient's exercise tolerance.  2. Left carotid bruit - we will order bilateral carotid ultrasound.  3. Hyperlipidemia, on lovastatin 20 mg daily, her LDL is mildly elevated at 117, goal would be under 100, however in the settings of no known coronary artery disease, unless her stress test is positive we'll continue the same regimen and encourage to increase her exercise frequency.  Follow-up in 6 weeks.Corliss Blacker, MD  07/06/2015 10:07 AM    [facility] [place], [place], [place]  [address] Phone: [phone]; Fax: [phone]

## 2015-07-16 ENCOUNTER — Telehealth (HOSPITAL_COMMUNITY): Payer: Self-pay

## 2015-07-16 NOTE — Telephone Encounter (Signed)
Encounter complete. 

## 2015-07-21 ENCOUNTER — Ambulatory Visit (HOSPITAL_COMMUNITY)
Admission: RE | Admit: 2015-07-21 | Discharge: 2015-07-21 | Disposition: A | Discharge status: Home / Self Care | Payer: Medicare Other | Source: Ambulatory Visit | Attending: Cardiology | Admitting: Cardiology

## 2015-07-21 ENCOUNTER — Ambulatory Visit (HOSPITAL_BASED_OUTPATIENT_CLINIC_OR_DEPARTMENT_OTHER)
Admission: RE | Admit: 2015-07-21 | Discharge: 2015-07-21 | Disposition: A | Discharge status: Home / Self Care | Payer: Medicare Other | Source: Ambulatory Visit | Attending: Cardiology | Admitting: Cardiology

## 2015-07-21 DIAGNOSIS — R0609 Other forms of dyspnea: Secondary | ICD-10-CM | POA: Insufficient documentation

## 2015-07-21 DIAGNOSIS — R0602 Shortness of breath: Secondary | ICD-10-CM | POA: Insufficient documentation

## 2015-07-21 DIAGNOSIS — Z5189 Encounter for other specified aftercare: Secondary | ICD-10-CM | POA: Insufficient documentation

## 2015-07-21 DIAGNOSIS — R0989 Other specified symptoms and signs involving the circulatory and respiratory systems: Secondary | ICD-10-CM | POA: Diagnosis not present

## 2015-07-21 DIAGNOSIS — E785 Hyperlipidemia, unspecified: Secondary | ICD-10-CM

## 2015-07-21 DIAGNOSIS — Z923 Personal history of irradiation: Secondary | ICD-10-CM

## 2015-07-21 DIAGNOSIS — Z8249 Family history of ischemic heart disease and other diseases of the circulatory system: Secondary | ICD-10-CM | POA: Diagnosis not present

## 2015-07-21 DIAGNOSIS — R5383 Other fatigue: Secondary | ICD-10-CM | POA: Insufficient documentation

## 2015-07-21 DIAGNOSIS — R002 Palpitations: Secondary | ICD-10-CM | POA: Insufficient documentation

## 2015-07-21 DIAGNOSIS — R42 Dizziness and giddiness: Secondary | ICD-10-CM | POA: Diagnosis not present

## 2015-07-21 DIAGNOSIS — I340 Nonrheumatic mitral (valve) insufficiency: Secondary | ICD-10-CM | POA: Diagnosis not present

## 2015-07-21 DIAGNOSIS — I071 Rheumatic tricuspid insufficiency: Secondary | ICD-10-CM | POA: Insufficient documentation

## 2015-07-21 DIAGNOSIS — I6523 Occlusion and stenosis of bilateral carotid arteries: Secondary | ICD-10-CM | POA: Insufficient documentation

## 2015-07-21 DIAGNOSIS — R0600 Dyspnea, unspecified: Secondary | ICD-10-CM | POA: Diagnosis present

## 2015-07-21 DIAGNOSIS — I779 Disorder of arteries and arterioles, unspecified: Secondary | ICD-10-CM | POA: Insufficient documentation

## 2015-07-21 DIAGNOSIS — I5189 Other ill-defined heart diseases: Secondary | ICD-10-CM | POA: Diagnosis not present

## 2015-07-21 LAB — MYOCARDIAL PERFUSION IMAGING
Estimated workload: 8.2 METS
Exercise duration (min): 8 min
Exercise duration (sec): 11 s
LV dias vol: 57 mL
LV sys vol: 15 mL
MPHR: 146 {beats}/min
Peak HR: 136 {beats}/min
Percent HR: 95 %
Percent of predicted max HR: 93 %
RPE: 17
Rest HR: 53 {beats}/min
SDS: 1
SRS: 0
SSS: 1
Stage 1 DBP: 80 mmHg
Stage 1 Grade: 0 %
Stage 1 HR: 55 {beats}/min
Stage 1 SBP: 152 mmHg
Stage 1 Speed: 0 mph
Stage 2 Grade: 0 %
Stage 2 HR: 58 {beats}/min
Stage 2 Speed: 1 mph
Stage 3 Grade: 0 %
Stage 3 HR: 59 {beats}/min
Stage 3 Speed: 1 mph
Stage 4 DBP: 76 mmHg
Stage 4 Grade: 10 %
Stage 4 HR: 96 {beats}/min
Stage 4 SBP: 125 mmHg
Stage 4 Speed: 1.7 mph
Stage 5 DBP: 78 mmHg
Stage 5 Grade: 12 %
Stage 5 HR: 120 {beats}/min
Stage 5 SBP: 158 mmHg
Stage 5 Speed: 2.5 mph
Stage 6 Grade: 14 %
Stage 6 HR: 139 {beats}/min
Stage 6 Speed: 3.4 mph
Stage 7 Grade: 14 %
Stage 7 HR: 136 {beats}/min
Stage 7 Speed: 2.5 mph
Stage 8 DBP: 95 mmHg
Stage 8 Grade: 0 %
Stage 8 HR: 112 {beats}/min
Stage 8 SBP: 203 mmHg
Stage 8 Speed: 0 mph
Stage 9 DBP: 74 mmHg
Stage 9 Grade: 0 %
Stage 9 HR: 73 {beats}/min
Stage 9 SBP: 132 mmHg
Stage 9 Speed: 0 mph
TID: 1.04

## 2015-07-21 MED ORDER — TECHNETIUM TC 99M SESTAMIBI GENERIC - CARDIOLITE
31.2000 | Freq: Once | INTRAVENOUS | Status: AC | PRN
  Administered 2015-07-21: 31.2 via INTRAVENOUS

## 2015-07-21 MED ORDER — TECHNETIUM TC 99M SESTAMIBI GENERIC - CARDIOLITE
10.9000 | Freq: Once | INTRAVENOUS | Status: AC | PRN
  Administered 2015-07-21: 10.9 via INTRAVENOUS

## 2015-07-22 ENCOUNTER — Telehealth: Payer: Self-pay | Admitting: *Deleted

## 2015-07-22 DIAGNOSIS — E785 Hyperlipidemia, unspecified: Secondary | ICD-10-CM

## 2015-07-22 MED ORDER — LOSARTAN POTASSIUM 25 MG PO TABS: 25.0000 mg | ORAL_TABLET | Freq: Every day | ORAL | Status: DC

## 2015-07-22 NOTE — Telephone Encounter (Signed)
-----   Message from Dorothy Spark, MD sent at 07/22/2015  1:34 PM EST ----- Normal stress test, no ischemia, hypertensive response to stress, please start losartan 25 mg po daily and repeat CMP in 1 month.

## 2015-07-22 NOTE — Telephone Encounter (Signed)
Notified the pt that per Dr Meda Coffee she had a normal stress test, no ischemia, but hypertensive response to the stress part of the test, so Dr Meda Coffee recommends that we start her on losartan 25 mg po daily and do a CMET in one month.  Confirmed the pharmacy of choice with the pt.  Pt request for her lab appt to be scheduled on the same day she comes in to the office to see Dr Meda Coffee on 08/14/15, for commute reasons.  Scheduled the pts lab appt as requested for 08/14/15 to check a cmet.  Pt verbalized understanding and agrees with this plan.

## 2015-08-14 ENCOUNTER — Other Ambulatory Visit (INDEPENDENT_AMBULATORY_CARE_PROVIDER_SITE_OTHER): Payer: Medicare Other | Admitting: *Deleted

## 2015-08-14 ENCOUNTER — Encounter: Payer: Self-pay | Admitting: Cardiology

## 2015-08-14 ENCOUNTER — Ambulatory Visit (INDEPENDENT_AMBULATORY_CARE_PROVIDER_SITE_OTHER): Payer: Medicare Other | Admitting: Cardiology

## 2015-08-14 VITALS — BP 152/80 | HR 69 | Ht 62.0 in | Wt 151.0 lb

## 2015-08-14 DIAGNOSIS — R0609 Other forms of dyspnea: Secondary | ICD-10-CM | POA: Diagnosis not present

## 2015-08-14 DIAGNOSIS — E785 Hyperlipidemia, unspecified: Secondary | ICD-10-CM

## 2015-08-14 DIAGNOSIS — I110 Hypertensive heart disease with heart failure: Secondary | ICD-10-CM

## 2015-08-14 DIAGNOSIS — R0600 Dyspnea, unspecified: Secondary | ICD-10-CM

## 2015-08-14 NOTE — Patient Instructions (Signed)
Medication Instructions:  Your physician recommends that you continue on your current medications as directed. Please refer to the Current Medication list given to you today.   Labwork: Your physician recommends that you return for a FASTING lipid profile:/bmet/tsh/lft/lipid   Testing/Procedures: -None  Follow-Up: Your physician wants you to follow-up in: 6 month f/u with Dr. Meda Coffee. You will receive a reminder letter in the mail two months in advance. If you don't receive a letter, please call our office to schedule the follow-up appointment.   Any Other Special Instructions Will Be Listed Below (If Applicable).     If you need a refill on your cardiac medications before your next appointment, please call your pharmacy.

## 2015-08-14 NOTE — Progress Notes (Signed)
Patient ID: Allison Wells, female   DOB: August 27, 1940, 75 y.o.   MRN: [id number]      Cardiology Office Note   Date:  08/14/2015   ID:  Allison Wells, DOB 1940-06-10, MRN [id number]  PCP:  Leonard Downing, MD  Cardiologist:   Dorothy Spark, MD   Chief complaint: Shortness of breath, palpitations.  History of Present Illness: Allison Wells is a 75 y.o. female who presents for evaluation of dyspnea on exertion. This is a very pleasant young-appearing 75 year old female who is a wife of my patient. The patient has no prior known history of heart problem. She was treated with 30 radiation therapy is an anastrozole for breast cancer 8 years ago. She has noticed shortness of breath since then but it has been worsening over the last year. On moderate exertion she also has noticed pressure in her chest. She denies any syncope. She feels occasional palpitations that are not associated with dizziness or syncope. In her family her mother had myocardial infarction in her 46s, father died of stroke, and grandfather had heart attack in his 85s. She eats fairly healthy and stays active but is not on a regular exercise program. She is currently using lovastatin for a straw management. Her cholesterol in May 2016 was triglycerides 107, HDL 73 and LDL 117.  Follow-up visit patient states that the initiation of losartan and her symptoms have improved mildly she is very motivated to exercise she denies any chest pain, palpitations or syncope.  Past Medical History  Diagnosis Date  . Breast cancer ([place]) 09/27/2005    Right breast  . Osteoporosis   . Hyperlipidemia   . Gallstones 1970  . Bulging lumbar disc 2008  . Carpal tunnel syndrome on right   . Sebaceous cyst 2011   Past Surgical History  Procedure Laterality Date  . Cholecystectomy  1970  . Appendectomy  1970  . Tubal ligation  1970  . Breast lumpectomy Right 09/27/2005  . Breast mass excision Right 10/10/2005  . Carpal tunnel release  2010    Current Outpatient Prescriptions  Medication Sig Dispense Refill  . cetirizine (ZYRTEC) 10 MG tablet Take 10 mg by mouth daily as needed for allergies.    Marland Kitchen FLUoxetine (PROZAC) 20 MG capsule Take 20 mg by mouth daily.    Marland Kitchen losartan (COZAAR) 25 MG tablet Take 1 tablet (25 mg total) by mouth daily. 90 tablet 3  . lovastatin (MEVACOR) 20 MG tablet Take 20 mg by mouth at bedtime.    . polyethylene glycol powder (MIRALAX) powder Take 17 g by mouth daily.    . ranitidine (ZANTAC) 150 MG tablet Take 150-300 mg by mouth 2 (two) times daily as needed for heartburn.     No current facility-administered medications for this visit.   Allergies:   Review of patient's allergies indicates no known allergies.   Social History:  The patient  reports that she has never smoked. She has never used smokeless tobacco. She reports that she drinks about 4.2 oz of alcohol per week. She reports that she does not use illicit drugs.   Family History:  The patient's family history includes Alzheimer's disease in her mother; Cancer in her cousin and paternal grandfather; Emphysema in her father; Heart Problems in her mother; Stroke in her father.   ROS:  Please see the history of present illness.   Otherwise, review of systems are positive for none.   All other systems are reviewed and negative.   PHYSICAL  EXAM: VS:  BP 152/80 mmHg  Pulse 69  Ht 5\' 2"  (1.575 m)  Wt 151 lb (68.493 kg)  BMI 27.61 kg/m2 , BMI Body mass index is 27.61 kg/(m^2). GEN: Well nourished, well developed, in no acute distress HEENT: normal Neck: no JVD, positive for left carotid bruits, or masses Cardiac: RRR; no murmurs, rubs, or gallops,no edema  Respiratory:  clear to auscultation bilaterally, normal work of breathing GI: soft, nontender, nondistended, + BS MS: no deformity or atrophy Skin: warm and dry, no rash Neuro:  Strength and sensation are intact Psych: euthymic mood, full affect   EKG:  EKG is not ordered today. EKG  strips from her primary care physician showed normal sinus rhythm.   Recent Labs: No results found for requested labs within last 365 days. as per history of present illness  Lipid Panel No results found for: CHOL, TRIG, HDL, CHOLHDL, VLDL, LDLCALC, LDLDIRECT as per history of present illness   Wt Readings from Last 3 Encounters:  08/14/15 151 lb (68.493 kg)  07/21/15 151 lb (68.493 kg)  07/06/15 151 lb (68.493 kg)    TTE: 07/21/2015 Study Conclusions  - Left ventricle: The cavity size was normal. Wall thickness was  normal. Systolic function was normal. The estimated ejection  fraction was in the range of 55% to 60%. Wall motion was normal;  there were no regional wall motion abnormalities. Doppler  parameters are consistent with abnormal left ventricular  relaxation (grade 1 diastolic dysfunction). - Mitral valve: There was mild regurgitation.  Impressions:  - Normal LV systolic function; grade 1 diastolic dysfunction; mild  MR and TR.  Exercise nuclear stress test: 07/21/2015  The left ventricular ejection fraction is hyperdynamic (>65%).  Nuclear stress EF: 75%.  Blood pressure demonstrated a hypertensive response to exercise.  The myocardial perfusion imaging is normal. No ischemia noted.  This is a low risk study.  Nondiagnostic EKG for ischemia. Downsloping ST segments noted in recovery lasting 12 minutes.  EKG-normal sinus rhythm, incomplete right bundle branch block otherwise normal   ASSESSMENT AND PLAN:  75 year old female with worsening dyspnea on exertion and prior intense radiation to her chest.  1. Shortness of breath and dyspnea on exertion, echocardiogram showed normal systolic LV ejection fraction with grade 1 diastolic dysfunction otherwise only mild mitral and tricuspid regurgitation. Her stress test again showed normal function and no ischemia no prior infarct but hypertensive response to exercise. She was started on losartan 25 mg  daily and feels some improvement in her symptoms. She showed good exercise tolerance on stress test.  2. Hypertensive heart disease - as above  3. Left carotid bruit - Normal carotid US.  4. Hyperlipidemia, on lovastatin 20 mg daily, her LDL is mildly elevated at 117, goal would be under 100, however in the settings of no known coronary artery disease, unless her stress test is positive we'll continue the same regimen and encourage to increase her exercise frequency.  Follow-up in one year, she is encouraged to continue exercising. We will check CMP and lipids now.  Signed, Dorothy Spark, MD  08/14/2015 9:05 AM    [facility], [place], [place]  [address] Phone: [phone]; Fax: [phone]

## 2015-08-19 ENCOUNTER — Other Ambulatory Visit (INDEPENDENT_AMBULATORY_CARE_PROVIDER_SITE_OTHER): Payer: Medicare Other | Admitting: *Deleted

## 2015-08-19 DIAGNOSIS — E785 Hyperlipidemia, unspecified: Secondary | ICD-10-CM

## 2015-08-19 LAB — BASIC METABOLIC PANEL
BUN: 20 mg/dL (ref 7–25)
CO2: 22 mmol/L (ref 20–31)
Calcium: 9.4 mg/dL (ref 8.6–10.4)
Chloride: 100 mmol/L (ref 98–110)
Creat: 0.82 mg/dL (ref 0.60–0.93)
Glucose, Bld: 89 mg/dL (ref 65–99)
Potassium: 4.1 mmol/L (ref 3.5–5.3)
Sodium: 135 mmol/L (ref 135–146)

## 2015-08-19 LAB — TSH: TSH: 3.47 mIU/L

## 2015-08-19 LAB — LIPID PANEL
Cholesterol: 233 mg/dL — ABNORMAL HIGH (ref 125–200)
HDL: 83 mg/dL (ref >=46)
LDL Cholesterol: 129 mg/dL (ref <130)
Total CHOL/HDL Ratio: 2.8 Ratio (ref <=5.0)
Triglycerides: 105 mg/dL (ref <150)
VLDL: 21 mg/dL (ref <30)

## 2015-08-19 LAB — HEPATIC FUNCTION PANEL
ALT: 15 U/L (ref 6–29)
AST: 15 U/L (ref 10–35)
Albumin: 3.7 g/dL (ref 3.6–5.1)
Alkaline Phosphatase: 76 U/L (ref 33–130)
Bilirubin, Direct: 0.1 mg/dL (ref <=0.2)
Indirect Bilirubin: 0.6 mg/dL (ref 0.2–1.2)
Total Bilirubin: 0.7 mg/dL (ref 0.2–1.2)
Total Protein: 6.6 g/dL (ref 6.1–8.1)

## 2015-08-19 NOTE — Addendum Note (Signed)
Addended by: Eulis Foster on: 08/19/2015 08:39 AM   Modules accepted: Orders

## 2015-08-19 NOTE — Addendum Note (Signed)
Addended by: Eulis Foster on: 08/19/2015 08:38 AM   Modules accepted: Orders

## 2015-08-20 ENCOUNTER — Telehealth: Payer: Self-pay | Admitting: Cardiology

## 2015-08-20 ENCOUNTER — Encounter: Payer: Self-pay | Admitting: *Deleted

## 2015-08-20 NOTE — Addendum Note (Signed)
Addended by: Freada Bergeron on: 08/20/2015 03:19 PM   Modules accepted: Orders

## 2015-08-20 NOTE — Telephone Encounter (Signed)
Follow up   Pt stating she is retuning call of rn

## 2015-08-20 NOTE — Telephone Encounter (Signed)
Notified the pt that per Dr Meda Coffee her bmet was normal, LFTs were normal, TG was normal, and TSH was normal. Did however inform the pt that per Dr Meda Coffee she has slightly increased LDL, and she should watch her diet more strictly.  Pt requesting references on foods to eat to aid in lowering her cholesterol and requested her lab report to be sent to her confirmed mailing address on file.  Informed the pt that I will mail both requested items to her address on file.  Pt verbalized understanding, agrees with this plan, and so very gracious for all the assistance provided.

## 2015-09-30 ENCOUNTER — Encounter: Payer: Self-pay | Admitting: Oncology

## 2015-12-01 ENCOUNTER — Encounter: Payer: Self-pay | Admitting: Oncology

## 2016-03-09 ENCOUNTER — Encounter (INDEPENDENT_AMBULATORY_CARE_PROVIDER_SITE_OTHER): Payer: Self-pay

## 2016-03-09 ENCOUNTER — Ambulatory Visit (INDEPENDENT_AMBULATORY_CARE_PROVIDER_SITE_OTHER): Payer: Medicare Other | Admitting: Cardiology

## 2016-03-09 VITALS — BP 124/70 | HR 61 | Ht 62.0 in | Wt 154.0 lb

## 2016-03-09 DIAGNOSIS — R0602 Shortness of breath: Secondary | ICD-10-CM | POA: Diagnosis not present

## 2016-03-09 DIAGNOSIS — I110 Hypertensive heart disease with heart failure: Secondary | ICD-10-CM | POA: Diagnosis not present

## 2016-03-09 DIAGNOSIS — I119 Hypertensive heart disease without heart failure: Secondary | ICD-10-CM

## 2016-03-09 DIAGNOSIS — E782 Mixed hyperlipidemia: Secondary | ICD-10-CM

## 2016-03-09 NOTE — Progress Notes (Signed)
Patient ID: Allison Wells, female   DOB: [birth year], 75 y.o.   MRN: [id number]      Cardiology Office Note   Date:  03/09/2016   ID:  Allison Wells, DOB 1940-10-30, MRN [id number]  PCP:  Leonard Downing, MD  Cardiologist:   Ena Dawley, MD   Chief complaint: Shortness of breath, palpitations.  History of Present Illness: Allison Wells is a 75 y.o. female who presents for evaluation of dyspnea on exertion. This is a very pleasant young-appearing 75 year old female who is a wife of my patient. The patient has no prior known history of heart problem. She was treated with 30 radiation therapy is an anastrozole for breast cancer 8 years ago. She has noticed shortness of breath since then but it has been worsening over the last year. On moderate exertion she also has noticed pressure in her chest. She denies any syncope. She feels occasional palpitations that are not associated with dizziness or syncope. In her family her mother had myocardial infarction in her 93s, father died of stroke, and grandfather had heart attack in his 8s. She eats fairly healthy and stays active but is not on a regular exercise program. She is currently using lovastatin for a straw management. Her cholesterol in May 2016 was triglycerides 107, HDL 73 and LDL 117.  Follow-up visit patient states that the initiation of losartan and her symptoms have improved mildly she is very motivated to exercise she denies any chest pain, palpitations or syncope.  03/09/2016 - 6 months follow up, the patient underwent nuclear stress testing that was negative for a prior scar or ischemia  And she had an echocardiogram that showed normal LVEF and grade 1 DD, age appropriate. She states that she feels great, she enjoys exercising and has no complains!  She has been complaint with her meds with no side effects.  Denies CP, DOE, palpitations or syncope.     Past Medical History:  Diagnosis Date  . Breast cancer ([place]) 09/27/2005   Right breast  . Bulging lumbar disc 2008  . Carpal tunnel syndrome on right   . Gallstones 1970  . Hyperlipidemia   . Osteoporosis   . Sebaceous cyst 2011   Past Surgical History:  Procedure Laterality Date  . APPENDECTOMY  1970  . BREAST LUMPECTOMY Right 09/27/2005  . BREAST MASS EXCISION Right 10/10/2005  . CARPAL TUNNEL RELEASE  2010  . CHOLECYSTECTOMY  1970  . TUBAL LIGATION  1970   Current Outpatient Prescriptions  Medication Sig Dispense Refill  . FLUoxetine (PROZAC) 20 MG capsule Take 20 mg by mouth daily.    . Loratadine 10 MG CAPS Take 1 capsule by mouth daily.    Marland Kitchen losartan (COZAAR) 25 MG tablet Take 1 tablet (25 mg total) by mouth daily. 90 tablet 3  . lovastatin (MEVACOR) 20 MG tablet Take 20 mg by mouth at bedtime.    Marland Kitchen omeprazole (PRILOSEC) 20 MG capsule Take 20 mg by mouth daily.    . polyethylene glycol powder (MIRALAX) powder Take 17 g by mouth daily.     No current facility-administered medications for this visit.    Allergies:   Review of patient's allergies indicates no known allergies.   Social History:  The patient  reports that she has never smoked. She has never used smokeless tobacco. She reports that she drinks about 4.2 oz of alcohol per week . She reports that she does not use drugs.   Family History:  The patient's family history  includes Alzheimer's disease in her mother; Cancer in her cousin and paternal grandfather; Emphysema in her father; Heart Problems in her mother; Stroke in her father.   ROS:  Please see the history of present illness.   Otherwise, review of systems are positive for none.   All other systems are reviewed and negative.   PHYSICAL EXAM: VS:  BP 124/70   Pulse 61   Ht 5\' 2"  (1.575 m)   Wt 154 lb (69.9 kg)   BMI 28.17 kg/m  , BMI Body mass index is 28.17 kg/m. GEN: Well nourished, well developed, in no acute distress  HEENT: normal  Neck: no JVD, positive for left carotid bruits, or masses Cardiac: RRR; no murmurs, rubs,  or gallops,no edema  Respiratory:  clear to auscultation bilaterally, normal work of breathing GI: soft, nontender, nondistended, + BS MS: no deformity or atrophy  Skin: warm and dry, no rash Neuro:  Strength and sensation are intact Psych: euthymic mood, full affect   EKG:  EKG is not ordered today. EKG strips from her primary care physician showed normal sinus rhythm.   Recent Labs: 08/19/2015: ALT 15; BUN 20; Creat 0.82; Potassium 4.1; Sodium 135; TSH 3.47 as per history of present illness  Lipid Panel    Component Value Date/Time   CHOL 233 (H) 08/19/2015 0840   TRIG 105 08/19/2015 0840   HDL 83 08/19/2015 0840   CHOLHDL 2.8 08/19/2015 0840   VLDL 21 08/19/2015 0840   LDLCALC 129 08/19/2015 0840   as per history of present illness   Wt Readings from Last 3 Encounters:  03/09/16 154 lb (69.9 kg)  08/14/15 151 lb (68.5 kg)  07/21/15 151 lb (68.5 kg)    TTE: 07/21/2015 Study Conclusions  - Left ventricle: The cavity size was normal. Wall thickness was  normal. Systolic function was normal. The estimated ejection  fraction was in the range of 55% to 60%. Wall motion was normal;  there were no regional wall motion abnormalities. Doppler  parameters are consistent with abnormal left ventricular  relaxation (grade 1 diastolic dysfunction). - Mitral valve: There was mild regurgitation.  Impressions:  - Normal LV systolic function; grade 1 diastolic dysfunction; mild  MR and TR.  Exercise nuclear stress test: 07/21/2015  The left ventricular ejection fraction is hyperdynamic (>65%).  Nuclear stress EF: 75%.  Blood pressure demonstrated a hypertensive response to exercise.  The myocardial perfusion imaging is normal. No ischemia noted.  This is a low risk study.  Nondiagnostic EKG for ischemia. Downsloping ST segments noted in recovery lasting 12 minutes.  EKG-normal sinus rhythm, incomplete right bundle branch block otherwise normal ECG on 03/09/16  shows SR with PACs that are new, [id number]  ASSESSMENT AND PLAN:  75 year old female with worsening dyspnea on exertion and prior intense radiation to her chest.  1. Shortness of breath and dyspnea on exertion, echocardiogram showed normal systolic LV ejection fraction with grade 1 diastolic dysfunction otherwise only mild mitral and tricuspid regurgitation. Her stress test again showed normal function and no ischemia no prior infarct but hypertensive response to exercise.  Symptoms improved with better HTN control. Encourage to continue exercising.  2. Hypertensive heart disease without heart failure- as above  3. Left carotid bruit - Normal carotid US.  4. Hyperlipidemia, on lovastatin 20 mg daily, to be rechecked by her PCP the next week, I will follow.   Follow up in 6 months.   Signed, Ena Dawley, MD  03/09/2016 8:40 AM    [facility] [place], [place], [place]  [address] Phone: [phone]; Fax: [phone]

## 2016-03-09 NOTE — Patient Instructions (Signed)
Your physician recommends that you continue on your current medications as directed. Please refer to the Current Medication list given to you today. Your physician wants you to follow-up in: 6 months with Dr. Nelson.  You will receive a reminder letter in the mail two months in advance. If you don't receive a letter, please call our office to schedule the follow-up appointment.  

## 2016-07-11 ENCOUNTER — Other Ambulatory Visit: Payer: Self-pay | Admitting: Cardiology

## 2016-07-11 DIAGNOSIS — E785 Hyperlipidemia, unspecified: Secondary | ICD-10-CM

## 2016-07-18 ENCOUNTER — Encounter: Payer: Self-pay | Admitting: Gastroenterology

## 2016-08-02 ENCOUNTER — Encounter: Payer: Self-pay | Admitting: Gastroenterology

## 2016-09-09 ENCOUNTER — Encounter: Payer: Self-pay | Admitting: Gastroenterology

## 2016-09-09 ENCOUNTER — Ambulatory Visit (AMBULATORY_SURGERY_CENTER): Payer: Self-pay | Admitting: *Deleted

## 2016-09-09 VITALS — Ht 62.0 in | Wt 151.0 lb

## 2016-09-09 DIAGNOSIS — Z122 Encounter for screening for malignant neoplasm of respiratory organs: Secondary | ICD-10-CM

## 2016-09-09 MED ORDER — NA SULFATE-K SULFATE-MG SULF 17.5-3.13-1.6 GM/177ML PO SOLN
ORAL | 0 refills | Status: DC
Start: 2016-09-09 — End: 2016-09-22

## 2016-09-09 NOTE — Progress Notes (Signed)
Pt denies allergies to eggs or soy products. Denies difficulty with sedation or anesthesia. Denies any diet or weight loss medications. Denies use of supplemental oxygen.  Emmi instructions given for procedure.  

## 2016-09-22 ENCOUNTER — Encounter: Payer: Self-pay | Admitting: Gastroenterology

## 2016-09-22 ENCOUNTER — Ambulatory Visit (AMBULATORY_SURGERY_CENTER): Payer: Medicare Other | Admitting: Gastroenterology

## 2016-09-22 VITALS — BP 134/62 | HR 61 | Temp 97.5°F | Resp 12 | Ht 62.0 in | Wt 151.0 lb

## 2016-09-22 DIAGNOSIS — Z1211 Encounter for screening for malignant neoplasm of colon: Secondary | ICD-10-CM

## 2016-09-22 DIAGNOSIS — Z1212 Encounter for screening for malignant neoplasm of rectum: Secondary | ICD-10-CM | POA: Diagnosis not present

## 2016-09-22 MED ORDER — SODIUM CHLORIDE 0.9 % IV SOLN: 500.0000 mL | INTRAVENOUS | Status: DC

## 2016-09-22 NOTE — Patient Instructions (Signed)

## 2016-09-22 NOTE — Progress Notes (Signed)
No changes since previsit. 

## 2016-09-22 NOTE — Progress Notes (Signed)
A/ox3 pleased with MAC, report to Megan RN 

## 2016-09-22 NOTE — Op Note (Signed)
[place] Patient Name: Allison Wells Procedure Date: 09/22/2016 7:38 AM MRN: [id number] Endoscopist: Mauri Pole , MD Age: 76 Referring MD:  Date of Birth: 16-Jan-1941 Gender: Female Account #: [id number] Procedure:                Colonoscopy Indications:              Screening for colorectal malignant neoplasm, Last                            colonoscopy: 2008 Medicines:                Monitored Anesthesia Care Procedure:                Pre-Anesthesia Assessment:                           - Prior to the procedure, a History and Physical                            was performed, and patient medications and                            allergies were reviewed. The patient's tolerance of                            previous anesthesia was also reviewed. The risks                            and benefits of the procedure and the sedation                            options and risks were discussed with the patient.                            All questions were answered, and informed consent                            was obtained. Prior Anticoagulants: The patient has                            taken no previous anticoagulant or antiplatelet                            agents. ASA Grade Assessment: II - A patient with                            mild systemic disease. After reviewing the risks                            and benefits, the patient was deemed in                            satisfactory condition to undergo the procedure.  After obtaining informed consent, the colonoscope                            was passed under direct vision. Throughout the                            procedure, the patient's blood pressure, pulse, and                            oxygen saturations were monitored continuously. The                            Colonoscope was introduced through the anus and                            advanced to the the terminal ileum, with                             identification of the appendiceal orifice and IC                            valve. The colonoscopy was performed without                            difficulty. The patient tolerated the procedure                            well. The quality of the bowel preparation was                            excellent. The terminal ileum, ileocecal valve,                            appendiceal orifice, and rectum were photographed. Scope In: 8:11:21 AM Scope Out: 8:28:05 AM Scope Withdrawal Time: 0 hours 11 minutes 21 seconds  Total Procedure Duration: 0 hours 16 minutes 44 seconds  Findings:                 The perianal and digital rectal examinations were                            normal.                           Non-bleeding internal hemorrhoids were found during                            retroflexion. The hemorrhoids were small. Complications:            No immediate complications. Estimated Blood Loss:     Estimated blood loss: none. Impression:               - Non-bleeding internal hemorrhoids.                           - No specimens collected. Recommendation:           -  Patient has a contact number available for                            emergencies. The signs and symptoms of potential                            delayed complications were discussed with the                            patient. Return to normal activities tomorrow.                            Written discharge instructions were provided to the                            patient.                           - Resume previous diet.                           - Continue present medications.                           - Repeat colonoscopy in 10 years for screening                            purposes.                           - Return to GI clinic PRN. Mauri Pole, MD 09/22/2016 8:50:24 AM This report has been signed electronically.

## 2016-09-23 ENCOUNTER — Telehealth: Payer: Self-pay | Admitting: *Deleted

## 2016-09-23 NOTE — Telephone Encounter (Signed)
   Follow up Call-  Call back number 09/22/2016  Post procedure Call Back phone  # [phone]  Permission to leave phone message Yes  Some recent data might be hidden     Patient questions:  Do you have a fever, pain , or abdominal swelling? No. Pain Score  0 *  Have you tolerated food without any problems? Yes.    Have you been able to return to your normal activities? Yes.    Do you have any questions about your discharge instructions: Diet   No. Medications  No. Follow up visit  No.  Do you have questions or concerns about your Care? No.  Actions: * If pain score is 4 or above: No action needed, pain <4.

## 2016-11-16 ENCOUNTER — Ambulatory Visit (INDEPENDENT_AMBULATORY_CARE_PROVIDER_SITE_OTHER): Payer: Medicare Other | Admitting: Cardiology

## 2016-11-16 ENCOUNTER — Encounter: Payer: Self-pay | Admitting: Cardiology

## 2016-11-16 VITALS — BP 136/64 | HR 67 | Ht 62.0 in | Wt 152.0 lb

## 2016-11-16 DIAGNOSIS — I110 Hypertensive heart disease with heart failure: Secondary | ICD-10-CM

## 2016-11-16 DIAGNOSIS — R0609 Other forms of dyspnea: Secondary | ICD-10-CM | POA: Diagnosis not present

## 2016-11-16 DIAGNOSIS — E782 Mixed hyperlipidemia: Secondary | ICD-10-CM

## 2016-11-16 DIAGNOSIS — R61 Generalized hyperhidrosis: Secondary | ICD-10-CM | POA: Diagnosis not present

## 2016-11-16 LAB — BASIC METABOLIC PANEL
BUN/Creatinine Ratio: 23 (ref 12–28)
BUN: 16 mg/dL (ref 8–27)
CO2: 24 mmol/L (ref 20–29)
Calcium: 9.7 mg/dL (ref 8.7–10.3)
Chloride: 99 mmol/L (ref 96–106)
Creatinine, Ser: 0.69 mg/dL (ref 0.57–1.00)
GFR calc Af Amer: 98 mL/min/{1.73_m2} (ref >59)
GFR calc non Af Amer: 85 mL/min/{1.73_m2} (ref >59)
Glucose: 97 mg/dL (ref 65–99)
Potassium: 4.8 mmol/L (ref 3.5–5.2)
Sodium: 139 mmol/L (ref 134–144)

## 2016-11-16 NOTE — Patient Instructions (Addendum)
Medication Instructions:   Your physician recommends that you continue on your current medications as directed. Please refer to the Current Medication list given to you today.    Your physician recommends that you return for lab work in: TODAY--BMET--FOR CORONARY CT PROTOCOL     Testing/Procedures:  CORONARY CT WITH FFR FOR DR NELSON TO READ---PLEASE SCHEDULE PER DR NELSON     Follow-Up:  Your physician wants you to follow-up in: [place] will receive a reminder letter in the mail two months in advance. If you don't receive a letter, please call our office to schedule the follow-up appointment.        If you need a refill on your cardiac medications before your next appointment, please call your pharmacy.

## 2016-11-16 NOTE — Progress Notes (Signed)
Patient ID: Allison Wells, female   DOB: 1940-07-19, 76 y.o.   MRN: [id number]      Cardiology Office Note   Date:  11/16/2016   ID:  Allison Wells, DOB 07/31/1940, MRN [id number]  PCP:  Leonard Downing, MD  Cardiologist:   Ena Dawley, MD   Chief complaint: Shortness of breath, diaphoresis.  History of Present Illness: Allison Wells is a 76 y.o. female who presents for evaluation of dyspnea on exertion. This is a very pleasant young-appearing 76 year old female who is a wife of my patient. The patient has no prior known history of heart problem. She was treated with 30 radiation therapy is an anastrozole for breast cancer 8 years ago. She has noticed shortness of breath since then but it has been worsening over the last year. On moderate exertion she also has noticed pressure in her chest. She denies any syncope. She feels occasional palpitations that are not associated with dizziness or syncope. In her family her mother had myocardial infarction in her 90s, father died of stroke, and grandfather had heart attack in his 34s. She eats fairly healthy and stays active but is not on a regular exercise program. She is currently using lovastatin for a straw management. Her cholesterol in May 2016 was triglycerides 107, HDL 73 and LDL 117.  Follow-up visit patient states that the initiation of losartan and her symptoms have improved mildly she is very motivated to exercise she denies any chest pain, palpitations or syncope.  03/09/2016 - 6 months follow up, the patient underwent nuclear stress testing that was negative for a prior scar or ischemia  And she had an echocardiogram that showed normal LVEF and grade 1 DD, age appropriate. She states that she feels great, she enjoys exercising and has no complains!  She has been complaint with her meds with no side effects.  Denies CP, DOE, palpitations or syncope.   11/16/2016 - 6 months follow-up, the patient states that she continues to be  short of breath to the point that she doesn't feel like doing house work. She is also noticed exertional profound sweating followed by chills with activity such as getting dressed. She has developed vertigo that's not related to activities, no syncope or falls. No lower extremity edema orthopnea or proximal nocturnal dyspnea.   Past Medical History:  Diagnosis Date  . Allergy   . Breast cancer ([place]) 09/27/2005   Right breast  . Bulging lumbar disc 2008  . Carpal tunnel syndrome on right   . Cataract   . Gallstones 1970  . GERD (gastroesophageal reflux disease)   . Hyperlipidemia   . Hypertension   . Osteoporosis   . Sebaceous cyst 2011   Past Surgical History:  Procedure Laterality Date  . APPENDECTOMY  1970  . BREAST LUMPECTOMY Right 09/27/2005  . BREAST MASS EXCISION Right 10/10/2005  . CARPAL TUNNEL RELEASE  2010  . CHOLECYSTECTOMY  1970  . COLONOSCOPY     2008  . TUBAL LIGATION  1970   Current Outpatient Prescriptions  Medication Sig Dispense Refill  . cyanocobalamin 2000 MCG tablet Take 2,500 mcg by mouth daily.    . Loratadine 10 MG CAPS Take 1 capsule by mouth daily.    Marland Kitchen losartan (COZAAR) 25 MG tablet TAKE 1 TABLET (25 MG TOTAL) BY MOUTH DAILY. 90 tablet 2  . lovastatin (MEVACOR) 20 MG tablet Take 20 mg by mouth at bedtime.    Marland Kitchen omeprazole (PRILOSEC) 20 MG capsule Take 20 mg  by mouth daily.    . polyethylene glycol powder (MIRALAX) powder Take 17 g by mouth daily.    Marland Kitchen venlafaxine (EFFEXOR) 37.5 MG tablet Take 37.5 mg by mouth 2 (two) times daily.     Current Facility-Administered Medications  Medication Dose Route Frequency Provider Last Rate Last Dose  . 0.9 %  sodium chloride infusion  500 mL Intravenous Continuous Nandigam, Venia Minks, MD       Allergies:   Patient has no known allergies.   Social History:  The patient  reports that she has never smoked. She has never used smokeless tobacco. She reports that she drinks about 4.2 oz of alcohol per week . She reports  that she does not use drugs.   Family History:  The patient's family history includes Alzheimer's disease in her mother; Cancer in her cousin and paternal grandfather; Diabetes in her paternal aunt; Emphysema in her father; Heart Problems in her mother; Heart disease in her father and mother; Stomach cancer in her paternal grandfather; Stroke in her father.   ROS:  Please see the history of present illness.   Otherwise, review of systems are positive for none.   All other systems are reviewed and negative.   PHYSICAL EXAM: VS:  BP 136/64   Pulse 67   Ht 5\' 2"  (1.575 m)   Wt 152 lb (68.9 kg)   BMI 27.80 kg/m  , BMI Body mass index is 27.8 kg/m. GEN: Well nourished, well developed, in no acute distress  HEENT: normal  Neck: no JVD, positive for left carotid bruits, or masses Cardiac: RRR; no murmurs, rubs, or gallops,no edema  Respiratory:  clear to auscultation bilaterally, normal work of breathing GI: soft, nontender, nondistended, + BS MS: no deformity or atrophy  Skin: warm and dry, no rash Neuro:  Strength and sensation are intact Psych: euthymic mood, full affect   EKG:  EKG is not ordered today. EKG strips from her primary care physician showed normal sinus rhythm.   Recent Labs: No results found for requested labs within last 8760 hours. as per history of present illness  Lipid Panel    Component Value Date/Time   CHOL 233 (H) 08/19/2015 0840   TRIG 105 08/19/2015 0840   HDL 83 08/19/2015 0840   CHOLHDL 2.8 08/19/2015 0840   VLDL 21 08/19/2015 0840   LDLCALC 129 08/19/2015 0840   as per history of present illness   Wt Readings from Last 3 Encounters:  11/16/16 152 lb (68.9 kg)  09/22/16 151 lb (68.5 kg)  09/09/16 151 lb (68.5 kg)    TTE: 07/21/2015 Study Conclusions  - Left ventricle: The cavity size was normal. Wall thickness was  normal. Systolic function was normal. The estimated ejection  fraction was in the range of 55% to 60%. Wall motion was  normal;  there were no regional wall motion abnormalities. Doppler  parameters are consistent with abnormal left ventricular  relaxation (grade 1 diastolic dysfunction). - Mitral valve: There was mild regurgitation.  Impressions:  - Normal LV systolic function; grade 1 diastolic dysfunction; mild  MR and TR.  Exercise nuclear stress test: 07/21/2015  The left ventricular ejection fraction is hyperdynamic (>65%).  Nuclear stress EF: 75%.  Blood pressure demonstrated a hypertensive response to exercise.  The myocardial perfusion imaging is normal. No ischemia noted.  This is a low risk study.  Nondiagnostic EKG for ischemia. Downsloping ST segments noted in recovery lasting 12 minutes.  EKG-performed today 11/16/2016 shows normal sinus rhythm nonspecific  ST-T wave abnormalities unchanged from prior.    ASSESSMENT AND PLAN:  76 year old female with worsening dyspnea on exertion and exertional diaphoresis.  1. Shortness of breath and dyspnea on exertion, echocardiogram showed normal systolic LV ejection fraction with grade 1 diastolic dysfunction otherwise only mild mitral and tricuspid regurgitation. Her stress test again showed normal function and no ischemia no prior infarct but hypertensive response to exercise. She also had ST depressions during stress echocardiogram. She continues to have exertional symptoms, we will schedule a coronary CTA to further evaluate.  2. Hypertensive heart disease without heart failure- she had a hypertensive response to stress that has improved with medications.  3. Left carotid bruit - Normal carotid US.  4. Hyperlipidemia, on lovastatin 20 mg daily, to be rechecked by her PCP the next week, I will follow. Based on results of coronary CTA we might change to more potent statins.  Follow up in 6 months.   Signed, Ena Dawley, MD  11/16/2016 9:40 AM    [address], [place], [place]   [id number] Phone: [phone]; Fax: [phone]

## 2016-11-21 ENCOUNTER — Ambulatory Visit (HOSPITAL_COMMUNITY): Admission: RE | Admit: 2016-11-21 | Payer: Medicare Other | Source: Ambulatory Visit

## 2016-11-21 ENCOUNTER — Ambulatory Visit (HOSPITAL_COMMUNITY)
Admission: RE | Admit: 2016-11-21 | Discharge: 2016-11-21 | Disposition: A | Discharge status: Home / Self Care | Payer: Medicare Other | Source: Ambulatory Visit | Attending: Cardiology | Admitting: Cardiology

## 2016-11-21 ENCOUNTER — Encounter (HOSPITAL_COMMUNITY): Payer: Self-pay

## 2016-11-21 DIAGNOSIS — R0609 Other forms of dyspnea: Secondary | ICD-10-CM | POA: Insufficient documentation

## 2016-11-21 DIAGNOSIS — D71 Functional disorders of polymorphonuclear neutrophils: Secondary | ICD-10-CM | POA: Insufficient documentation

## 2016-11-21 DIAGNOSIS — R0600 Dyspnea, unspecified: Secondary | ICD-10-CM | POA: Diagnosis not present

## 2016-11-21 DIAGNOSIS — R61 Generalized hyperhidrosis: Secondary | ICD-10-CM | POA: Insufficient documentation

## 2016-11-21 MED ORDER — NITROGLYCERIN 0.4 MG SL SUBL
SUBLINGUAL_TABLET | SUBLINGUAL | Status: AC
  Administered 2016-11-21: 0.8 mg via SUBLINGUAL
  Filled 2016-11-21: qty 2

## 2016-11-21 MED ORDER — METOPROLOL TARTRATE 5 MG/5ML IV SOLN
INTRAVENOUS | Status: AC
  Administered 2016-11-21: 5 mg via INTRAVENOUS
  Filled 2016-11-21: qty 5

## 2016-11-21 MED ORDER — IOPAMIDOL (ISOVUE-370) INJECTION 76%
INTRAVENOUS | Status: AC
  Administered 2016-11-21: 80 mL
  Filled 2016-11-21: qty 100

## 2016-11-21 MED ORDER — METOPROLOL TARTRATE 5 MG/5ML IV SOLN
5.0000 mg | Freq: Once | INTRAVENOUS | Status: AC
  Administered 2016-11-21: 5 mg via INTRAVENOUS

## 2016-11-21 MED ORDER — NITROGLYCERIN 0.4 MG SL SUBL
0.8000 mg | SUBLINGUAL_TABLET | Freq: Once | SUBLINGUAL | Status: AC
  Administered 2016-11-21: 0.8 mg via SUBLINGUAL

## 2016-11-22 ENCOUNTER — Telehealth: Payer: Self-pay | Admitting: *Deleted

## 2016-11-22 DIAGNOSIS — L929 Granulomatous disorder of the skin and subcutaneous tissue, unspecified: Secondary | ICD-10-CM | POA: Insufficient documentation

## 2016-11-22 DIAGNOSIS — Z853 Personal history of malignant neoplasm of breast: Secondary | ICD-10-CM

## 2016-11-22 NOTE — Telephone Encounter (Signed)
-----   Message from Dorothy Spark, MD sent at 11/21/2016  1:01 PM EDT ----- Calcium score of 0 and only very minimal coronary artery disease that doesn't need to be treated. This is an excellent result. 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

## 2016-11-22 NOTE — Telephone Encounter (Signed)
Notified the pt that per Dr Meda Coffee, her coronary ct showed that she had a  calcium score of 0 and only very minimal coronary artery disease that doesn't need to be treated. This is an excellent result.  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  Per the pt, she saw a Dr Lenna Gilford with [facility] a while back, and would like to be referred back to him if possible.  Informed the pt that I will place the referral in the system, and place a note that her preference is to be re-established with Dr Lenna Gilford if possible.  Informed the pt that I will send a message to our [facility] schedulers to coordinate this referral and follow-up with her accordingly.  Pt verbalized understanding and agrees with this plan.

## 2017-01-04 ENCOUNTER — Ambulatory Visit (INDEPENDENT_AMBULATORY_CARE_PROVIDER_SITE_OTHER): Payer: Medicare Other | Admitting: Internal Medicine

## 2017-01-04 ENCOUNTER — Encounter: Payer: Self-pay | Admitting: Internal Medicine

## 2017-01-04 ENCOUNTER — Other Ambulatory Visit (INDEPENDENT_AMBULATORY_CARE_PROVIDER_SITE_OTHER): Payer: Medicare Other

## 2017-01-04 VITALS — BP 120/74 | HR 77 | Ht 62.0 in | Wt 150.8 lb

## 2017-01-04 DIAGNOSIS — R0609 Other forms of dyspnea: Secondary | ICD-10-CM | POA: Diagnosis not present

## 2017-01-04 DIAGNOSIS — R0602 Shortness of breath: Secondary | ICD-10-CM | POA: Insufficient documentation

## 2017-01-04 LAB — CBC WITH DIFFERENTIAL/PLATELET
BASOS ABS: 0.1 10*3/uL (ref 0.0–0.1)
BASOS PCT: 1 % (ref 0.0–3.0)
EOS ABS: 0.2 10*3/uL (ref 0.0–0.7)
EOS PCT: 2.6 % (ref 0.0–5.0)
HEMATOCRIT: 41.6 % (ref 36.0–46.0)
Hemoglobin: 14.2 g/dL (ref 12.0–15.0)
LYMPHS ABS: 1.6 10*3/uL (ref 0.7–4.0)
Lymphocytes Relative: 27 % (ref 12.0–46.0)
MCHC: 34.1 g/dL (ref 30.0–36.0)
MCV: 87 fl (ref 78.0–100.0)
MONO ABS: 0.4 10*3/uL (ref 0.1–1.0)
Monocytes Relative: 6.9 % (ref 3.0–12.0)
Neutro Abs: 3.8 10*3/uL (ref 1.4–7.7)
Neutrophils Relative %: 62.5 % (ref 43.0–77.0)
Platelets: 286 10*3/uL (ref 150.0–400.0)
RBC: 4.79 Mil/uL (ref 3.87–5.11)
RDW: 13 % (ref 11.5–15.5)
WBC: 6 10*3/uL (ref 4.0–10.5)

## 2017-01-04 LAB — NITRIC OXIDE: NITRIC OXIDE: 23

## 2017-01-04 LAB — BRAIN NATRIURETIC PEPTIDE: Pro B Natriuretic peptide (BNP): 24 pg/mL (ref 0.0–100.0)

## 2017-01-04 NOTE — Patient Instructions (Addendum)
Prilosec 20 mg x 2  Daily 30 min before bfast  GERD (REFLUX)  is an extremely common cause of respiratory symptoms just like yours , many times with no obvious heartburn at all.    It can be treated with medication, but also with lifestyle changes including elevation of the head of your bed (ideally with 6 inch  bed blocks),  Smoking cessation, avoidance of late meals, excessive alcohol, and avoid fatty foods, chocolate, peppermint, colas, red wine, and acidic juices such as orange juice.  NO MINT OR MENTHOL PRODUCTS SO NO COUGH DROPS  USE SUGARLESS CANDY INSTEAD (Jolley ranchers or Stover's or Life Savers) or even ice chips will also do - the key is to swallow to prevent all throat clearing. NO OIL BASED VITAMINS - use powdered substitutes.    To get the most out of exercise, you need to be continuously aware that you are short of breath, but never out of breath, for 30 minutes daily. As you improve, it will actually be easier for you to do the same amount of exercise  in  30 minutes so always push to the level where you are short of breath.      Please see patient coordinator before you leave today  to schedule a cpst in 2 weeks and I will call with the results

## 2017-01-04 NOTE — Progress Notes (Signed)
Subjective:     Patient ID: Allison Wells, female   DOB: 01-14-1941,    MRN: [id number]  HPI   76 yowf never smoker healthy child/  Bothered by drippy nose year yound ever since her 76s never allergy tested/ never   asthma/ sedentary as adult but able to do yardwork with onset of sob after completed RT p R Breast CA dx 2007 (baseline wt 120 ) and gradually worse to point where has it walking room to room and mailbox and back with incline back to house but no need to stop and also sob with voice use, otherwise no problem at rest or waking her up at noct, referred to pulmonary clinic 01/04/2017 by Dr   Meda Coffee p neg cardiac eval    01/04/2017 1st [facility] office visit/ Cinde Ebert   Chief Complaint  Patient presents with  . Pulmonary Consult    Referred by Dr. Ottie Glazier. Pt c/o DOE- had RT in 2007 and started noticing SOB then, worse x 2 months. She has had minimal cough- non prod just for the past few wks. She states that she gets winded just walking across the room sometimes and also with exertion such as making her bed.   pnds x age 35's some better on clariton - takes qd year round gerd rx x 20 year on ppi 30 min before bfast  Sob not typically assoc with any cough at all but note sob worse with voice use even at rest    No obvious day to day or daytime variability or assoc excess/ purulent sputum or mucus plugs or hemoptysis or cp or chest tightness, subjective wheeze or overt sinus or hb symptoms. No unusual exp hx or h/o childhood pna/ asthma or knowledge of premature birth.  Sleeping ok without nocturnal  or early am exacerbation  of respiratory  c/o's or need for noct saba. Also denies any obvious fluctuation of symptoms with weather or environmental changes or other aggravating or alleviating factors except as outlined above   Current Medications, Allergies, Complete Past Medical History, Past Surgical History, Family History, and Social History were reviewed in [facility] record.  ROS  The following are not active complaints unless bolded sore throat, dysphagia, dental problems, itching, sneezing,  nasal congestion or excess/ purulent secretions, ear ache,   fever, chills, sweats, unintended wt loss, classically pleuritic or exertional cp,  orthopnea pnd or leg swelling, presyncope, palpitations, abdominal pain, anorexia, nausea, vomiting, diarrhea  or change in bowel or bladder habits, change in stools or urine, dysuria,hematuria,  rash, arthralgias, visual complaints, headache, numbness, weakness or ataxia or problems with walking or coordination,  change in mood/affect or memory.         Review of Systems     Objective:   Physical Exam  Pleasant amb wf nad   Wt Readings from Last 3 Encounters:  01/04/17 150 lb 12.8 oz (68.4 kg)  11/16/16 152 lb (68.9 kg)  09/22/16 151 lb (68.5 kg)    Vital signs reviewed   - Note on arrival 02 sats  96% on RA     HEENT: nl dentition, turbinates bilaterally, and oropharynx. Nl external ear canals without cough reflex   NECK :  without JVD/Nodes/TM/ nl carotid upstrokes bilaterally   LUNGS: no acc muscle use,  Nl contour chest which is clear to A and P bilaterally without cough on insp or exp maneuvers   CV:  RRR  no s3 or murmur or increase  in P2, and no edema   ABD:  soft and nontender with nl inspiratory excursion in the supine position. No bruits or organomegaly appreciated, bowel sounds nl  MS:  Nl gait/ ext warm without deformities, calf tenderness, cyanosis or clubbing No obvious joint restrictions   SKIN: warm and dry without lesions    NEURO:  alert, approp, nl sensorium with  no motor or cerebellar deficits apparent.      I personally reviewed images and agree with radiology impression as follows:   Chest CTa 11/21/16 Evidence of old granulomatous disease with calcified right hilar lymph nodes. No acute or significant extracardiac abnormality  Labs ordered/ reviewed:       Chemistry      Component Value Date/Time   NA 139 11/16/2016 0952   K 4.8 11/16/2016 0952   CL 99 11/16/2016 0952   CO2 24 11/16/2016 0952   BUN 16 11/16/2016 0952   CREATININE 0.69 11/16/2016 0952   CREATININE 0.82 08/19/2015 0840      Component Value Date/Time   CALCIUM 9.7 11/16/2016 0952   ALKPHOS 76 08/19/2015 0840   AST 15 08/19/2015 0840   ALT 15 08/19/2015 0840   BILITOT 0.7 08/19/2015 0840        Lab Results  Component Value Date   WBC 6.0 01/04/2017   HGB 14.2 01/04/2017   HCT 41.6 01/04/2017   MCV 87.0 01/04/2017   PLT 286.0 01/04/2017     Lab Results  Component Value Date   DDIMER 0.34 01/04/2017      Lab Results  Component Value Date   TSH 3.47 08/19/2015     Lab Results  Component Value Date   PROBNP 24.0 01/04/2017             Assessment:

## 2017-01-05 LAB — RESPIRATORY ALLERGY PROFILE REGION II ~~LOC~~
Allergen, A. alternata, m6: <0.1 kU/L
Allergen, Cedar tree, t12: <0.1 kU/L
Allergen, Comm Silver Birch, t9: <0.1 kU/L
Allergen, Oak,t7: <0.1 kU/L
Box Elder IgE: <0.1 kU/L
Cat Dander: <0.1 kU/L
Cockroach: <0.1 kU/L
D. farinae: <0.1 kU/L
Dog Dander: <0.1 kU/L
Elm IgE: <0.1 kU/L
IgE (Immunoglobulin E), Serum: 6 kU/L (ref <115)
Johnson Grass: <0.1 kU/L
Rough Pigweed  IgE: <0.1 kU/L
Sheep Sorrel IgE: <0.1 kU/L

## 2017-01-05 LAB — D-DIMER, QUANTITATIVE: D-Dimer, Quant: 0.34 mcg/mL FEU (ref <0.50)

## 2017-01-05 NOTE — Assessment & Plan Note (Addendum)
PFTs 07/04/13 wnl including f/v loop/dlco - FENO 01/04/2017  =   23  - Spirometry 01/04/2017  FEV1 1.89 (100%)  Ratio 76 with minimal curvature on no rx   - 01/04/2017  Walked RA x 3 laps @ 185 ft each stopped due to  End of study, brisk pace, no desat  - min sob   Symptoms are markedly disproportionate to objective findings and not clear this is actually much of a  lung problem but pt does appear to have difficult to sort out respiratory symptoms of unknown origin for which  DDX  = almost all start with A and  include Adherence, Ace Inhibitors, Acid Reflux, Active Sinus Disease, Alpha 1 Antitripsin deficiency, Anxiety masquerading as Airways dz,  ABPA,  Allergy(esp in young), Aspiration (esp in elderly), Adverse effects of meds,  Active smokers, A bunch of PE's (a small clot burden can't cause this syndrome unless there is already severe underlying pulm or vascular dz with poor reserve) plus two Bs  = Bronchiectasis and Beta blocker use..and one C= CHF    Adherence is always the initial "prime suspect" and is a multilayered concern that requires a "trust but verify" approach in every patient - starting with knowing how to use medications, especially inhalers, correctly, keeping up with refills and understanding the fundamental difference between maintenance and prns vs those medications only taken for a very short course and then stopped and not refilled.   ? Acid (or non-acid) GERD > always difficult to exclude as up to 75% of pts in some series report no assoc GI/ Heartburn symptoms> rec max (24h)  acid suppression and diet restrictions/ reviewed and instructions given in writing.   ? Allergy/ asthma >  Low feno rules against but she does have rhinitis so allergy profile sent   ? Active sinus dz/ rhinitis > continue clariton, await allergy profile   ? Anxiety > usually at the bottom of this list of usual suspects but should be much higher on this pt's based on H and P and note already on psychotropics and  we could not reproduce her daily sob x across the room. Also the sob at rest when speaking favors anxiety or LPR/ vcd  ? Adverse effects of meds/ prior RT > no evidence of this at all   ? A bunch of PE's > D dimer nl - while  A nl valute  may miss small peripheral pe, the clot burden with sob is moderately high and the d dimer has a very high neg pred value in this setting    ? CHF >  Neg myoview in 2017, nl CT 11/21/16 and bnp << 100 all rule strongly against though echo in 6237 showed diastolic dysfunction / no valvular dz or PAH  > CPST next study and probably the last one needed to sort this out  though would like her to try reconditioning exercises first    Total time devoted to counseling  > 50 % of initial 60 min office visit:  review case with pt/ discussion of options/alternatives/ personally creating written customized instructions  in presence of pt  then going over those specific  Instructions directly with the pt including how to use all of the meds but in particular covering each new medication in detail and the difference between the maintenance= "automatic" meds and the prns using an action plan format for the latter (If this problem/symptom => do that organization reading Left to right).  Please see AVS from  this visit for a full list of these instructions which I personally wrote for this pt and  are unique to this visit.

## 2017-01-05 NOTE — Progress Notes (Signed)
Spoke with pt and notified of results per Dr. Wert. Pt verbalized understanding and denied any questions. 

## 2017-01-18 ENCOUNTER — Ambulatory Visit (HOSPITAL_COMMUNITY): Payer: Medicare Other | Attending: Internal Medicine

## 2017-01-18 DIAGNOSIS — R030 Elevated blood-pressure reading, without diagnosis of hypertension: Secondary | ICD-10-CM | POA: Diagnosis not present

## 2017-01-18 DIAGNOSIS — R0600 Dyspnea, unspecified: Secondary | ICD-10-CM | POA: Insufficient documentation

## 2017-01-20 ENCOUNTER — Telehealth: Payer: Self-pay | Admitting: Internal Medicine

## 2017-01-20 ENCOUNTER — Other Ambulatory Visit (HOSPITAL_COMMUNITY): Payer: Self-pay | Admitting: *Deleted

## 2017-01-20 DIAGNOSIS — R0609 Other forms of dyspnea: Principal | ICD-10-CM

## 2017-01-20 NOTE — Telephone Encounter (Signed)
Call patient : Study suggests bp too high with exercise - would like her to See her cardiologist Dr Meda Coffee to go over the results/ strategy to keep BP lower - lung portion of test was nl so f/u here is prn.   Spoke with patient. She is aware of results. She states that she will call her cardiologist on Monday.

## 2017-01-23 ENCOUNTER — Telehealth: Payer: Self-pay | Admitting: Cardiology

## 2017-01-23 DIAGNOSIS — R0600 Dyspnea, unspecified: Secondary | ICD-10-CM | POA: Diagnosis not present

## 2017-01-23 NOTE — Telephone Encounter (Signed)
Results  Cardiopulmonary exercise test (Order [id number])  Cardiopulmonary exercise test  Order: [id number]  Status:  Final result Visible to patient:  No (Not Released) Next appt:  None Dx:  Dyspnea on exertion  Notes recorded by Len Blalock, CMA on 01/20/2017 at 2:17 PM EDT lmtcb X1 for pt to relay results/recs.  ------  Notes recorded by Tanda Rockers, MD on 01/20/2017 at 12:06 PM EDT Call patient : Study suggests bp too high with exercise - would like her to See her cardiologist Dr Meda Coffee to go over the results/ strategy to keep BP lower - lung portion of test was nl so f/u here is prn      Pt states she had a CPX done by Dr Melvyn Novas on 8/17, and she was advised by Dr Melvyn Novas to touch base with Dr Meda Coffee, and have her look at the study, for the pts BP was too high during the exercise portion of the test.  Pt states that this is available for Dr Meda Coffee to take a look at through [facility].  Informed the pt that Dr Meda Coffee is out of the office today, but I will route this message to her for further review of CPX done and I will follow-up with the pt accordingly, once recommendations received.  Pt verbalized understanding and agrees with this plan.

## 2017-01-23 NOTE — Telephone Encounter (Signed)
New Message  Pt call requesting to speak with Dr Meda Coffee. Pt states she completed a CPX test  Last week with Dr. Morrison Old office. Pt states he bp was really high doing to exercise. Pt would like Dr. Meda Coffee to review the test to see if pt will need to increase bp medication or if the Dr. Meda Coffee Will need to see her. Please call back to discuss

## 2017-01-23 NOTE — Progress Notes (Signed)
LMTCB

## 2017-01-24 MED ORDER — LOSARTAN POTASSIUM 50 MG PO TABS: 50.0000 mg | ORAL_TABLET | Freq: Every day | ORAL | 2 refills | Status: DC

## 2017-01-24 NOTE — Telephone Encounter (Signed)
Spoke with the pt and informed her of Dr Francesca Oman recommendations for her to increase her Losartan to 50 mg po daily, and schedule follow-up with her or her PA.  Confirmed the pharmacy of choice with the pt.  Scheduled the pt to see Dr Meda Coffee this Thursday 8/23 at [facility].  Pt is aware to arrive 15 mins prior too this appt.  Pt verbalized understanding and agrees with this plan.

## 2017-01-24 NOTE — Telephone Encounter (Signed)
Mrs. Allison Wells is returning your call . Thanks

## 2017-01-24 NOTE — Telephone Encounter (Signed)
Please increase losartan to 50 mg po daily and schedule a follow up with me or a PA.

## 2017-01-24 NOTE — Telephone Encounter (Signed)
Left a message for the pt to call back to endorse recommendations per Dr Nelson. 

## 2017-01-25 ENCOUNTER — Encounter: Payer: Self-pay | Admitting: Cardiology

## 2017-01-25 NOTE — Progress Notes (Signed)
Spoke with pt and notified of results per Dr. Wert. Pt verbalized understanding and denied any questions. 

## 2017-01-26 ENCOUNTER — Encounter: Payer: Self-pay | Admitting: Cardiology

## 2017-01-26 ENCOUNTER — Ambulatory Visit (INDEPENDENT_AMBULATORY_CARE_PROVIDER_SITE_OTHER): Payer: Medicare Other | Admitting: Cardiology

## 2017-01-26 ENCOUNTER — Encounter (INDEPENDENT_AMBULATORY_CARE_PROVIDER_SITE_OTHER): Payer: Self-pay

## 2017-01-26 VITALS — BP 140/72 | HR 68 | Ht 62.0 in | Wt 149.0 lb

## 2017-01-26 DIAGNOSIS — E784 Other hyperlipidemia: Secondary | ICD-10-CM | POA: Diagnosis not present

## 2017-01-26 DIAGNOSIS — R0609 Other forms of dyspnea: Secondary | ICD-10-CM

## 2017-01-26 DIAGNOSIS — R0600 Dyspnea, unspecified: Secondary | ICD-10-CM

## 2017-01-26 DIAGNOSIS — E782 Mixed hyperlipidemia: Secondary | ICD-10-CM | POA: Diagnosis not present

## 2017-01-26 DIAGNOSIS — I110 Hypertensive heart disease with heart failure: Secondary | ICD-10-CM | POA: Diagnosis not present

## 2017-01-26 DIAGNOSIS — E7849 Other hyperlipidemia: Secondary | ICD-10-CM

## 2017-01-26 LAB — LIPID PANEL
Chol/HDL Ratio: 3.5 ratio (ref 0.0–4.4)
Cholesterol, Total: 232 mg/dL — ABNORMAL HIGH (ref 100–199)
HDL: 67 mg/dL (ref >39)
LDL Calculated: 144 mg/dL — ABNORMAL HIGH (ref 0–99)
Triglycerides: 104 mg/dL (ref 0–149)
VLDL Cholesterol Cal: 21 mg/dL (ref 5–40)

## 2017-01-26 LAB — COMPREHENSIVE METABOLIC PANEL
ALT: 26 IU/L (ref 0–32)
AST: 30 IU/L (ref 0–40)
Albumin/Globulin Ratio: 1.7 (ref 1.2–2.2)
Albumin: 4.3 g/dL (ref 3.5–4.8)
Alkaline Phosphatase: 100 IU/L (ref 39–117)
BUN/Creatinine Ratio: 24 (ref 12–28)
BUN: 18 mg/dL (ref 8–27)
Bilirubin Total: 0.5 mg/dL (ref 0.0–1.2)
CO2: 23 mmol/L (ref 20–29)
Calcium: 8.9 mg/dL (ref 8.7–10.3)
Chloride: 100 mmol/L (ref 96–106)
Creatinine, Ser: 0.76 mg/dL (ref 0.57–1.00)
GFR calc Af Amer: 88 mL/min/{1.73_m2} (ref >59)
GFR calc non Af Amer: 76 mL/min/{1.73_m2} (ref >59)
Globulin, Total: 2.5 g/dL (ref 1.5–4.5)
Glucose: 97 mg/dL (ref 65–99)
Potassium: 4.4 mmol/L (ref 3.5–5.2)
Sodium: 139 mmol/L (ref 134–144)
Total Protein: 6.8 g/dL (ref 6.0–8.5)

## 2017-01-26 LAB — TSH: TSH: 3.36 u[IU]/mL (ref 0.450–4.500)

## 2017-01-26 LAB — CBC WITH DIFFERENTIAL/PLATELET
Basophils Absolute: 0 10*3/uL (ref 0.0–0.2)
Basos: 1 %
EOS (ABSOLUTE): 0.1 10*3/uL (ref 0.0–0.4)
Eos: 2 %
Hematocrit: 40.4 % (ref 34.0–46.6)
Hemoglobin: 13.7 g/dL (ref 11.1–15.9)
Immature Grans (Abs): 0 10*3/uL (ref 0.0–0.1)
Immature Granulocytes: 0 %
Lymphocytes Absolute: 1.3 10*3/uL (ref 0.7–3.1)
Lymphs: 26 %
MCH: 29.4 pg (ref 26.6–33.0)
MCHC: 33.9 g/dL (ref 31.5–35.7)
MCV: 87 fL (ref 79–97)
Monocytes Absolute: 0.4 10*3/uL (ref 0.1–0.9)
Monocytes: 7 %
Neutrophils Absolute: 3.3 10*3/uL (ref 1.4–7.0)
Neutrophils: 64 %
Platelets: 272 10*3/uL (ref 150–379)
RBC: 4.66 x10E6/uL (ref 3.77–5.28)
RDW: 12.8 % (ref 12.3–15.4)
WBC: 5.1 10*3/uL (ref 3.4–10.8)

## 2017-01-26 MED ORDER — HYDROCHLOROTHIAZIDE 25 MG PO TABS: 25.0000 mg | ORAL_TABLET | Freq: Every day | ORAL | 3 refills | Status: DC

## 2017-01-26 NOTE — Progress Notes (Signed)
Patient ID: Allison Wells, female   DOB: 06/10/1940, 76 y.o.   MRN: [id number]      Cardiology Office Note   Date:  01/26/2017   ID:  Allison Wells, Allison Wells 12-11-1940, MRN [id number]  PCP:  Leonard Downing, MD  Cardiologist:   Ena Dawley, MD   Chief complaint: Shortness of breath, HTN.  History of Present Illness: Allison Wells is a 76 y.o. female who presents for evaluation of dyspnea on exertion. This is a very pleasant young-appearing 76 year old female who is a wife of my patient. The patient has no prior known history of heart problem. She was treated with 30 radiation therapy is an anastrozole for breast cancer 8 years ago. She has noticed shortness of breath since then but it has been worsening over the last year. On moderate exertion she also has noticed pressure in her chest. She denies any syncope. She feels occasional palpitations that are not associated with dizziness or syncope. In her family her mother had myocardial infarction in her 25s, father died of stroke, and grandfather had heart attack in his 14s. She eats fairly healthy and stays active but is not on a regular exercise program. She is currently using lovastatin for a straw management. Her cholesterol in May 2016 was triglycerides 107, HDL 73 and LDL 117.  03/09/2016 - 6 months follow up, the patient underwent nuclear stress testing that was negative for a prior scar or ischemia  And she had an echocardiogram that showed normal LVEF and grade 1 DD, age appropriate. She states that she feels great, she enjoys exercising and has no complains!  She has been complaint with her meds with no side effects.  Denies CP, DOE, palpitations or syncope.   11/16/2016 - 6 months follow-up, the patient states that she continues to be short of breath to the point that she doesn't feel like doing house work. She is also noticed exertional profound sweating followed by chills with activity such as getting dressed. She has developed  vertigo that's not related to activities, no syncope or falls. No lower extremity edema orthopnea or proximal nocturnal dyspnea.  01/26/2017 - 2 months follow-up, the patient underwent coronary CTA that showed a calcium score of 0 and no evidence of coronary artery disease. Because of findings of old granulomatous disease on chest CT Dr. Melvyn Novas ordered cardiopulmonary stress test that showed normal lung function, VO85max of 19 that represents 121% of predicted maximum. Her blood pressure rose to 240 mmHg. Losartan was increased to 50 mg daily. Her symptoms are otherwise unchanged she denies any recent chest pain continues to have exertional dyspnea. No actual edema orthopnea or proximal nocturnal dyspnea no palpitations.   Past Medical History:  Diagnosis Date  . Allergy   . Breast cancer ([place]) 09/27/2005   Right breast  . Bulging lumbar disc 2008  . Carpal tunnel syndrome on right   . Cataract   . Gallstones 1970  . GERD (gastroesophageal reflux disease)   . Hyperlipidemia   . Hypertension   . Osteoporosis   . Sebaceous cyst 2011   Past Surgical History:  Procedure Laterality Date  . APPENDECTOMY  1970  . BREAST LUMPECTOMY Right 09/27/2005  . BREAST MASS EXCISION Right 10/10/2005  . CARPAL TUNNEL RELEASE  2010  . CHOLECYSTECTOMY  1970  . COLONOSCOPY     2008  . TUBAL LIGATION  1970   Current Outpatient Prescriptions  Medication Sig Dispense Refill  . Cholecalciferol (VITAMIN D) 2000 units CAPS  Take 1 capsule by mouth daily.    . cyanocobalamin 2000 MCG tablet Take 2,500 mcg by mouth daily.    . Loratadine 10 MG CAPS Take 1 capsule by mouth daily.    Marland Kitchen losartan (COZAAR) 50 MG tablet Take 1 tablet (50 mg total) by mouth daily. 90 tablet 2  . lovastatin (MEVACOR) 20 MG tablet Take 20 mg by mouth at bedtime.    Marland Kitchen omeprazole (PRILOSEC) 20 MG capsule Take 20 mg by mouth daily.    . polyethylene glycol powder (MIRALAX) powder Take 17 g by mouth daily.    Marland Kitchen venlafaxine (EFFEXOR) 37.5 MG  tablet Take 37.5 mg by mouth daily.      Current Facility-Administered Medications  Medication Dose Route Frequency Provider Last Rate Last Dose  . 0.9 %  sodium chloride infusion  500 mL Intravenous Continuous Nandigam, Venia Minks, MD       Allergies:   Patient has no known allergies.   Social History:  The patient  reports that she has never smoked. She has never used smokeless tobacco. She reports that she drinks about 4.2 oz of alcohol per week . She reports that she does not use drugs.   Family History:  The patient's family history includes Alzheimer's disease in her mother; Cancer in her cousin and paternal grandfather; Diabetes in her paternal aunt; Emphysema in her father; Heart Problems in her mother; Heart disease in her father and mother; Stomach cancer in her paternal grandfather; Stroke in her father.   ROS:  Please see the history of present illness.   Otherwise, review of systems are positive for none.   All other systems are reviewed and negative.   PHYSICAL EXAM: VS:  BP 140/72   Pulse 68   Ht 5\' 2"  (1.575 m)   Wt 149 lb (67.6 kg)   BMI 27.25 kg/m  , BMI Body mass index is 27.25 kg/m. GEN: Well nourished, well developed, in no acute distress  HEENT: normal  Neck: no JVD, positive for left carotid bruits, or masses Cardiac: RRR; no murmurs, rubs, or gallops,no edema  Respiratory:  clear to auscultation bilaterally, normal work of breathing GI: soft, nontender, nondistended, + BS MS: no deformity or atrophy  Skin: warm and dry, no rash Neuro:  Strength and sensation are intact Psych: euthymic mood, full affect   EKG:  EKG is not ordered today. EKG strips from her primary care physician showed normal sinus rhythm.   Recent Labs: 11/16/2016: BUN 16; Creatinine, Ser 0.69; Potassium 4.8; Sodium 139 01/04/2017: Hemoglobin 14.2; Platelets 286.0; Pro B Natriuretic peptide (BNP) 24.0 as per history of present illness  Lipid Panel    Component Value Date/Time   CHOL  233 (H) 08/19/2015 0840   TRIG 105 08/19/2015 0840   HDL 83 08/19/2015 0840   CHOLHDL 2.8 08/19/2015 0840   VLDL 21 08/19/2015 0840   LDLCALC 129 08/19/2015 0840   as per history of present illness   Wt Readings from Last 3 Encounters:  01/26/17 149 lb (67.6 kg)  01/04/17 150 lb 12.8 oz (68.4 kg)  11/16/16 152 lb (68.9 kg)    TTE: 07/21/2015 Study Conclusions  - Left ventricle: The cavity size was normal. Wall thickness was  normal. Systolic function was normal. The estimated ejection  fraction was in the range of 55% to 60%. Wall motion was normal;  there were no regional wall motion abnormalities. Doppler  parameters are consistent with abnormal left ventricular  relaxation (grade 1 diastolic dysfunction). -  Mitral valve: There was mild regurgitation.  Impressions:  - Normal LV systolic function; grade 1 diastolic dysfunction; mild  MR and TR.  Exercise nuclear stress test: 07/21/2015  The left ventricular ejection fraction is hyperdynamic (>65%).  Nuclear stress EF: 75%.  Blood pressure demonstrated a hypertensive response to exercise.  The myocardial perfusion imaging is normal. No ischemia noted.  This is a low risk study.  Nondiagnostic EKG for ischemia. Downsloping ST segments noted in recovery lasting 12 minutes.  EKG-performed today 11/16/2016 shows normal sinus rhythm nonspecific ST-T wave abnormalities unchanged from prior.    ASSESSMENT AND PLAN:  1. Shortness of breath and dyspnea on exertion, echocardiogram showed normal systolic LV ejection fraction with grade 1 diastolic dysfunction otherwise only mild mitral and tricuspid regurgitation. Her stress test again showed normal function and no ischemia no prior infarct but hypertensive response to exercise. She also had ST depressions during stress echocardiogram. A coronary CTA showed no evidence for CAD. Normal PFTs. Her symptoms are secondary to uncontrolled hypertension on exertion.  2.  Hypertensive heart disease without heart failure- she had a hypertensive response to stress, we will further increase losartan to 50 mg daily and add hydrochlorothiazide 25 mg daily. We will follow-up in 2 months and if she continues to have symptoms we will plan for exercise treadmill stress test to evaluate blood pressure response during exertion.  3. Left carotid bruit - Normal carotid US.  4. Hyperlipidemia, on lovastatin 20 mg daily, we'll recheck today.  Follow up in 2 months. Check lipids CMP CBC and TSH today.  Signed, Ena Dawley, MD  01/26/2017 9:02 AM    [facility] [place], [place], [place]  [address] Phone: [phone]; Fax: [phone]

## 2017-01-26 NOTE — Patient Instructions (Signed)
Medication Instructions:   START TAKING HYDROCHLOROTHIAZIDE 25 MG ONCE DAILY    Labwork:  TODAY--CMET, CBC W DIFF, TSH, AND LIPIDS     Follow-Up:  2 MONTHS WITH DR Meda Coffee       If you need a refill on your cardiac medications before your next appointment, please call your pharmacy.

## 2017-01-31 NOTE — Progress Notes (Signed)
Yes, her husband, I am sorry

## 2017-02-01 ENCOUNTER — Telehealth: Payer: Self-pay | Admitting: *Deleted

## 2017-02-01 DIAGNOSIS — E785 Hyperlipidemia, unspecified: Secondary | ICD-10-CM

## 2017-02-01 MED ORDER — ROSUVASTATIN CALCIUM 10 MG PO TABS: 10.0000 mg | ORAL_TABLET | Freq: Every day | ORAL | 3 refills | Status: DC

## 2017-02-01 NOTE — Telephone Encounter (Signed)
-----   Message from Nuala Alpha, LPN sent at 4/74/2595 11:35 AM EDT -----   ----- Message ----- From: Dorothy Spark, MD Sent: 01/31/2017  12:42 PM To: Nuala Alpha, LPN  I would start crestor 10 mg po daily and synthroid 25 mcg daily, please repeat Lipids, CMP and TSH in 6 weeks. Thank you, KN

## 2017-02-01 NOTE — Telephone Encounter (Signed)
Clarification provided verbally by Dr Meda Coffee, on this pts lab results and recommendations.  Per Dr Meda Coffee, verbal order given for this pt to stop Lovastatin, and start Crestor 10 mg po daily.  Per Dr Meda Coffee, disregard the synthroid order and TSH lab.  Per Dr Meda Coffee, the pt still needs to come in for lab in 6 weeks to assess CMET and Lipids.  Per Dr Meda Coffee, the TSH result and order is for her Spouse, Allison Wells, who also had lab same day as the pt.  Confirmed the pharmacy of choice with the pt.  Scheduled her repeat lab to check a cmet and lipids in 6 weeks, on 03/15/17.  Pt is aware to come fasting to this lab appt.  Pt verbalized understanding and agrees with this plan.

## 2017-03-15 ENCOUNTER — Other Ambulatory Visit: Payer: Medicare Other | Admitting: *Deleted

## 2017-03-15 DIAGNOSIS — E785 Hyperlipidemia, unspecified: Secondary | ICD-10-CM

## 2017-03-15 LAB — COMPREHENSIVE METABOLIC PANEL
ALT: 28 IU/L (ref 0–32)
AST: 35 IU/L (ref 0–40)
Albumin/Globulin Ratio: 1.8 (ref 1.2–2.2)
Albumin: 4.2 g/dL (ref 3.5–4.8)
Alkaline Phosphatase: 98 IU/L (ref 39–117)
BUN/Creatinine Ratio: 19 (ref 12–28)
BUN: 14 mg/dL (ref 8–27)
Bilirubin Total: 0.5 mg/dL (ref 0.0–1.2)
CO2: 27 mmol/L (ref 20–29)
Calcium: 9.5 mg/dL (ref 8.7–10.3)
Chloride: 97 mmol/L (ref 96–106)
Creatinine, Ser: 0.73 mg/dL (ref 0.57–1.00)
GFR calc Af Amer: 93 mL/min/{1.73_m2} (ref >59)
GFR calc non Af Amer: 80 mL/min/{1.73_m2} (ref >59)
Globulin, Total: 2.4 g/dL (ref 1.5–4.5)
Glucose: 100 mg/dL — ABNORMAL HIGH (ref 65–99)
Potassium: 3.9 mmol/L (ref 3.5–5.2)
Sodium: 138 mmol/L (ref 134–144)
Total Protein: 6.6 g/dL (ref 6.0–8.5)

## 2017-03-15 LAB — LIPID PANEL
Chol/HDL Ratio: 2.6 ratio (ref 0.0–4.4)
Cholesterol, Total: 176 mg/dL (ref 100–199)
HDL: 67 mg/dL (ref >39)
LDL Calculated: 95 mg/dL (ref 0–99)
Triglycerides: 69 mg/dL (ref 0–149)
VLDL Cholesterol Cal: 14 mg/dL (ref 5–40)

## 2017-04-05 ENCOUNTER — Encounter: Payer: Self-pay | Admitting: Cardiology

## 2017-04-05 ENCOUNTER — Ambulatory Visit (INDEPENDENT_AMBULATORY_CARE_PROVIDER_SITE_OTHER): Payer: Medicare Other | Admitting: Cardiology

## 2017-04-05 VITALS — BP 110/60 | HR 67 | Ht 62.0 in | Wt 152.0 lb

## 2017-04-05 DIAGNOSIS — I119 Hypertensive heart disease without heart failure: Secondary | ICD-10-CM | POA: Diagnosis not present

## 2017-04-05 DIAGNOSIS — E785 Hyperlipidemia, unspecified: Secondary | ICD-10-CM | POA: Diagnosis not present

## 2017-04-05 DIAGNOSIS — R0609 Other forms of dyspnea: Secondary | ICD-10-CM | POA: Diagnosis not present

## 2017-04-05 DIAGNOSIS — R0600 Dyspnea, unspecified: Secondary | ICD-10-CM

## 2017-04-05 NOTE — Progress Notes (Signed)
Patient ID: Allison Wells, female   DOB: January 18, 1941, 76 y.o.   MRN: [id number]      Cardiology Office Note   Date:  04/05/2017   ID:  Allison Wells, Allison Wells 1940-12-22, MRN [id number]  PCP:  Leonard Downing, MD  Cardiologist:   Ena Dawley, MD   Chief complaint: Shortness of breath, HTN.  History of Present Illness: Allison Wells is a 76 y.o. female who presents for evaluation of dyspnea on exertion. This is a very pleasant young-appearing 76 year old female who is a wife of my patient. The patient has no prior known history of heart problem. She was treated with 30 radiation therapy is an anastrozole for breast cancer 8 years ago. She has noticed shortness of breath since then but it has been worsening over the last year. On moderate exertion she also has noticed pressure in her chest. She denies any syncope. She feels occasional palpitations that are not associated with dizziness or syncope. In her family her mother had myocardial infarction in her 36s, father died of stroke, and grandfather had heart attack in his 72s. She eats fairly healthy and stays active but is not on a regular exercise program. She is currently using lovastatin for a straw management. Her cholesterol in May 2016 was triglycerides 107, HDL 73 and LDL 117.  03/09/2016 - 6 months follow up, the patient underwent nuclear stress testing that was negative for a prior scar or ischemia  And she had an echocardiogram that showed normal LVEF and grade 1 DD, age appropriate. She states that she feels great, she enjoys exercising and has no complains!  She has been complaint with her meds with no side effects.  Denies CP, DOE, palpitations or syncope.   01/26/2017 - 2 months follow-up, the patient underwent coronary CTA that showed a calcium score of 0 and no evidence of coronary artery disease. Because of findings of old granulomatous disease on chest CT Dr. Melvyn Novas ordered cardiopulmonary stress test that showed normal lung  function, VO65max of 19 that represents 121% of predicted maximum. Her blood pressure rose to 240 mmHg. Losartan was increased to 50 mg daily. Her symptoms are otherwise unchanged she denies any recent chest pain continues to have exertional dyspnea. No actual edema orthopnea or proximal nocturnal dyspnea no palpitations.  04/05/2017 -3 months follow-up, patient has been switched to Crestor as her cholesterol was 144 with pravastatin and it responded very well currently 95 with normal LFTs.  She has no side effects.  She exercises no chest pain or shortness of breath.  Her blood pressure is now well controlled.  She has no dizziness palpitations or falls.   Past Medical History:  Diagnosis Date  . Allergy   . Breast cancer ([place]) 09/27/2005   Right breast  . Bulging lumbar disc 2008  . Carpal tunnel syndrome on right   . Cataract   . Gallstones 1970  . GERD (gastroesophageal reflux disease)   . Hyperlipidemia   . Hypertension   . Osteoporosis   . Sebaceous cyst 2011   Past Surgical History:  Procedure Laterality Date  . APPENDECTOMY  1970  . BREAST LUMPECTOMY Right 09/27/2005  . BREAST MASS EXCISION Right 10/10/2005  . CARPAL TUNNEL RELEASE  2010  . CHOLECYSTECTOMY  1970  . COLONOSCOPY     2008  . TUBAL LIGATION  1970   Current Outpatient Prescriptions  Medication Sig Dispense Refill  . Cholecalciferol (VITAMIN D) 2000 units CAPS Take 1 capsule by mouth daily.    Marland Kitchen  cyanocobalamin 2000 MCG tablet Take 2,500 mcg by mouth daily.    . hydrochlorothiazide (HYDRODIURIL) 25 MG tablet Take 1 tablet (25 mg total) by mouth daily. 90 tablet 3  . Loratadine 10 MG CAPS Take 1 capsule by mouth daily.    Marland Kitchen losartan (COZAAR) 50 MG tablet Take 1 tablet (50 mg total) by mouth daily. 90 tablet 2  . omeprazole (PRILOSEC) 20 MG capsule Take 20 mg by mouth daily.    . polyethylene glycol powder (MIRALAX) powder Take 17 g by mouth daily.    . rosuvastatin (CRESTOR) 10 MG tablet Take 1 tablet (10 mg total)  by mouth daily. 90 tablet 3  . venlafaxine (EFFEXOR) 37.5 MG tablet Take 37.5 mg by mouth daily.      Current Facility-Administered Medications  Medication Dose Route Frequency Provider Last Rate Last Dose  . 0.9 %  sodium chloride infusion  500 mL Intravenous Continuous Nandigam, Venia Minks, MD       Allergies:   Patient has no known allergies.   Social History:  The patient  reports that she has never smoked. She has never used smokeless tobacco. She reports that she drinks about 4.2 oz of alcohol per week . She reports that she does not use drugs.   Family History:  The patient's family history includes Alzheimer's disease in her mother; Cancer in her cousin and paternal grandfather; Diabetes in her paternal aunt; Emphysema in her father; Heart Problems in her mother; Heart disease in her father and mother; Stomach cancer in her paternal grandfather; Stroke in her father.   ROS:  Please see the history of present illness.   Otherwise, review of systems are positive for none.   All other systems are reviewed and negative.   PHYSICAL EXAM: VS:  BP 110/60   Pulse 67   Ht 5\' 2"  (1.575 m)   Wt 152 lb (68.9 kg)   SpO2 98%   BMI 27.80 kg/m  , BMI Body mass index is 27.8 kg/m. GEN: Well nourished, well developed, in no acute distress  HEENT: normal  Neck: no JVD, positive for left carotid bruits, or masses Cardiac: RRR; no murmurs, rubs, or gallops,no edema  Respiratory:  clear to auscultation bilaterally, normal work of breathing GI: soft, nontender, nondistended, + BS MS: no deformity or atrophy  Skin: warm and dry, no rash Neuro:  Strength and sensation are intact Psych: euthymic mood, full affect   EKG:  EKG is not ordered today. EKG strips from her primary care physician showed normal sinus rhythm.   Recent Labs: 01/04/2017: Pro B Natriuretic peptide (BNP) 24.0 01/26/2017: Hemoglobin 13.7; Platelets 272; TSH 3.360 03/15/2017: ALT 28; BUN 14; Creatinine, Ser 0.73; Potassium 3.9;  Sodium 138 as per history of present illness  Lipid Panel    Component Value Date/Time   CHOL 176 03/15/2017 0844   TRIG 69 03/15/2017 0844   HDL 67 03/15/2017 0844   CHOLHDL 2.6 03/15/2017 0844   CHOLHDL 2.8 08/19/2015 0840   VLDL 21 08/19/2015 0840   LDLCALC 95 03/15/2017 0844   as per history of present illness   Wt Readings from Last 3 Encounters:  04/05/17 152 lb (68.9 kg)  01/26/17 149 lb (67.6 kg)  01/04/17 150 lb 12.8 oz (68.4 kg)    TTE: 07/21/2015 Study Conclusions  - Left ventricle: The cavity size was normal. Wall thickness was  normal. Systolic function was normal. The estimated ejection  fraction was in the range of 55% to 60%. Wall motion  was normal;  there were no regional wall motion abnormalities. Doppler  parameters are consistent with abnormal left ventricular  relaxation (grade 1 diastolic dysfunction). - Mitral valve: There was mild regurgitation.  Impressions:  - Normal LV systolic function; grade 1 diastolic dysfunction; mild  MR and TR.  Exercise nuclear stress test: 07/21/2015  The left ventricular ejection fraction is hyperdynamic (>65%).  Nuclear stress EF: 75%.  Blood pressure demonstrated a hypertensive response to exercise.  The myocardial perfusion imaging is normal. No ischemia noted.  This is a low risk study.  Nondiagnostic EKG for ischemia. Downsloping ST segments noted in recovery lasting 12 minutes.  EKG-performed today 11/16/2016 shows normal sinus rhythm nonspecific ST-T wave abnormalities unchanged from prior.    ASSESSMENT AND PLAN:  1. Shortness of breath and dyspnea on exertion, echocardiogram showed normal systolic LV ejection fraction with grade 1 diastolic dysfunction otherwise only mild mitral and tricuspid regurgitation. Her stress test again showed normal function and no ischemia no prior infarct but hypertensive response to exercise. She also had ST depressions during stress echocardiogram. A coronary  CTA showed no evidence for CAD. Normal PFTs. Her symptoms are secondary to uncontrolled hypertension on exertion. She is currently completely asymptomatic we will continue the same regimen.  2. Hypertensive heart disease without heart failure- she had a hypertensive response to stress, she is now asymptomatic this regimen of losartan 50 mg and hydrochlorothiazide 25 mg daily.  3. Left carotid bruit - Normal carotid US.  4. Hyperlipidemia, well controlled on Crestor 10 mg daily with no side effects  Follow up in 1 year.  Signed, Ena Dawley, MD  04/05/2017 9:11 AM    [facility] [place], [place], [place]  [address] Phone: [phone]; Fax: [phone]

## 2017-04-05 NOTE — Patient Instructions (Signed)
Medication Instructions:  Your physician recommends that you continue on your current medications as directed. Please refer to the Current Medication list given to you today.   Labwork: None ordered  Testing/Procedures: None ordered  Follow-Up: Your physician wants you to follow-up in: 1 year with Dr. Nelson. You will receive a reminder letter in the mail two months in advance. If you don't receive a letter, please call our office to schedule the follow-up appointment.   Any Other Special Instructions Will Be Listed Below (If Applicable).     If you need a refill on your cardiac medications before your next appointment, please call your pharmacy.   

## 2017-10-09 ENCOUNTER — Other Ambulatory Visit: Payer: Self-pay | Admitting: Cardiology

## 2018-01-08 ENCOUNTER — Other Ambulatory Visit: Payer: Self-pay | Admitting: Cardiology

## 2018-01-08 DIAGNOSIS — E785 Hyperlipidemia, unspecified: Secondary | ICD-10-CM

## 2018-04-05 ENCOUNTER — Other Ambulatory Visit: Payer: Self-pay | Admitting: Cardiology

## 2018-04-05 DIAGNOSIS — E785 Hyperlipidemia, unspecified: Secondary | ICD-10-CM

## 2018-04-23 ENCOUNTER — Ambulatory Visit: Payer: Medicare Other | Admitting: Cardiology

## 2018-04-23 VITALS — BP 148/68 | HR 69 | Ht 62.0 in | Wt 155.0 lb

## 2018-04-23 DIAGNOSIS — E785 Hyperlipidemia, unspecified: Secondary | ICD-10-CM

## 2018-04-23 DIAGNOSIS — R0609 Other forms of dyspnea: Secondary | ICD-10-CM | POA: Diagnosis not present

## 2018-04-23 DIAGNOSIS — R0600 Dyspnea, unspecified: Secondary | ICD-10-CM

## 2018-04-23 DIAGNOSIS — I119 Hypertensive heart disease without heart failure: Secondary | ICD-10-CM | POA: Diagnosis not present

## 2018-04-23 LAB — CBC WITH DIFFERENTIAL/PLATELET
Basophils Absolute: 0.1 10*3/uL (ref 0.0–0.2)
Basos: 1 %
EOS (ABSOLUTE): 0.1 10*3/uL (ref 0.0–0.4)
Eos: 2 %
Hematocrit: 40.3 % (ref 34.0–46.6)
Hemoglobin: 13.6 g/dL (ref 11.1–15.9)
Immature Grans (Abs): 0 10*3/uL (ref 0.0–0.1)
Immature Granulocytes: 0 %
Lymphocytes Absolute: 1.6 10*3/uL (ref 0.7–3.1)
Lymphs: 22 %
MCH: 29.1 pg (ref 26.6–33.0)
MCHC: 33.7 g/dL (ref 31.5–35.7)
MCV: 86 fL (ref 79–97)
Monocytes Absolute: 0.6 10*3/uL (ref 0.1–0.9)
Monocytes: 9 %
Neutrophils Absolute: 4.6 10*3/uL (ref 1.4–7.0)
Neutrophils: 66 %
Platelets: 302 10*3/uL (ref 150–450)
RBC: 4.68 x10E6/uL (ref 3.77–5.28)
RDW: 12.3 % (ref 12.3–15.4)
WBC: 7 10*3/uL (ref 3.4–10.8)

## 2018-04-23 LAB — COMPREHENSIVE METABOLIC PANEL
ALT: 33 IU/L — ABNORMAL HIGH (ref 0–32)
AST: 35 IU/L (ref 0–40)
Albumin/Globulin Ratio: 1.8 (ref 1.2–2.2)
Albumin: 4.4 g/dL (ref 3.5–4.8)
Alkaline Phosphatase: 115 IU/L (ref 39–117)
BUN/Creatinine Ratio: 24 (ref 12–28)
BUN: 17 mg/dL (ref 8–27)
Bilirubin Total: 0.6 mg/dL (ref 0.0–1.2)
CO2: 22 mmol/L (ref 20–29)
Calcium: 9.3 mg/dL (ref 8.7–10.3)
Chloride: 99 mmol/L (ref 96–106)
Creatinine, Ser: 0.71 mg/dL (ref 0.57–1.00)
GFR calc Af Amer: 95 mL/min/{1.73_m2} (ref >59)
GFR calc non Af Amer: 82 mL/min/{1.73_m2} (ref >59)
Globulin, Total: 2.4 g/dL (ref 1.5–4.5)
Glucose: 94 mg/dL (ref 65–99)
Potassium: 4.1 mmol/L (ref 3.5–5.2)
Sodium: 136 mmol/L (ref 134–144)
Total Protein: 6.8 g/dL (ref 6.0–8.5)

## 2018-04-23 LAB — T4, FREE: Free T4: 0.91 ng/dL (ref 0.82–1.77)

## 2018-04-23 LAB — TSH: TSH: 5.13 u[IU]/mL — ABNORMAL HIGH (ref 0.450–4.500)

## 2018-04-23 LAB — T3, FREE: T3, Free: 2.8 pg/mL (ref 2.0–4.4)

## 2018-04-23 NOTE — Progress Notes (Signed)
Patient ID: [name], female   DOB: [birth year], 77 y.o.   MRN: [id number]      Cardiology Office Note   Date:  04/23/2018   ID:  Allison Wells, DOB 09-23-1940, MRN [id number]  PCP:  Leonard Downing, MD  Cardiologist:   Ena Dawley, MD   Chief complaint: Shortness of breath, HTN.  History of Present Illness: Allison Wells is a 77 y.o. female who presents for evaluation of dyspnea on exertion. This is a very pleasant young-appearing 77 year old female who is a wife of my patient. The patient has no prior known history of heart problem. She was treated with 30 radiation therapy is an anastrozole for breast cancer 8 years ago. She has noticed shortness of breath since then but it has been worsening over the last year. On moderate exertion she also has noticed pressure in her chest. She denies any syncope. She feels occasional palpitations that are not associated with dizziness or syncope. In her family her mother had myocardial infarction in her 52s, father died of stroke, and grandfather had heart attack in his 53s. She eats fairly healthy and stays active but is not on a regular exercise program. She is currently using lovastatin for a straw management. Her cholesterol in May 2016 was triglycerides 107, HDL 73 and LDL 117.  03/09/2016 - 6 months follow up, the patient underwent nuclear stress testing that was negative for a prior scar or ischemia  And she had an echocardiogram that showed normal LVEF and grade 1 DD, age appropriate. She states that she feels great, she enjoys exercising and has no complains!  She has been complaint with her meds with no side effects.  Denies CP, DOE, palpitations or syncope.   01/26/2017 - 2 months follow-up, the patient underwent coronary CTA that showed a calcium score of 0 and no evidence of coronary artery disease. Because of findings of old granulomatous disease on chest CT Dr. Melvyn Novas ordered cardiopulmonary stress test that showed normal lung  function, VO2max of 19 that represents 121% of predicted maximum. Her blood pressure rose to 240 mmHg. Losartan was increased to 50 mg daily. Her symptoms are otherwise unchanged she denies any recent chest pain continues to have exertional dyspnea. No actual edema orthopnea or proximal nocturnal dyspnea no palpitations.  04/05/2017 -3 months follow-up, patient has been switched to Crestor as her cholesterol was 144 with pravastatin and it responded very well currently 95 with normal LFTs.  She has no side effects.  She exercises no chest pain or shortness of breath.  Her blood pressure is now well controlled.  She has no dizziness palpitations or falls.  04/23/2018 -this is 1 year follow-up, the patient has been doing great, she is tolerating her medications, denies any chest pain or shortness of breath at rest, but gets dyspneic on exertion.  She has a rowing machine at home but does not use it.  She is tolerating Crestor.  She has no lower extremity edema orthopnea proximal nocturnal dyspnea.  No palpitations or falls.  She is complaining of excessive sweating since this last summer.   Past Medical History:  Diagnosis Date  . Allergy   . Breast cancer ([place]) 09/27/2005   Right breast  . Bulging lumbar disc 2008  . Carpal tunnel syndrome on right   . Cataract   . Gallstones 1970  . GERD (gastroesophageal reflux disease)   . Hyperlipidemia   . Hypertension   . Osteoporosis   . Sebaceous cyst  2011   Past Surgical History:  Procedure Laterality Date  . APPENDECTOMY  1970  . BREAST LUMPECTOMY Right 09/27/2005  . BREAST MASS EXCISION Right 10/10/2005  . CARPAL TUNNEL RELEASE  2010  . CHOLECYSTECTOMY  1970  . COLONOSCOPY     2008  . TUBAL LIGATION  1970   Current Outpatient Medications  Medication Sig Dispense Refill  . Cholecalciferol (VITAMIN D) 2000 units CAPS Take 1 capsule by mouth daily.    . cyanocobalamin 2000 MCG tablet Take 2,500 mcg by mouth daily.    . Loratadine 10 MG CAPS  Take 1 capsule by mouth daily.    Marland Kitchen losartan (COZAAR) 50 MG tablet TAKE ONE TABLET BY MOUTH ONE TIME DAILY  90 tablet 0  . omeprazole (PRILOSEC) 20 MG capsule Take 20 mg by mouth daily.    . polyethylene glycol powder (MIRALAX) powder Take 17 g by mouth daily.    . rosuvastatin (CRESTOR) 10 MG tablet  TAKE 1 TABLET BY MOUTH DAILY **NEEDS YEARLY APPOINTMENT** 90 tablet 0  . venlafaxine (EFFEXOR) 37.5 MG tablet Take 37.5 mg by mouth daily.     . hydrochlorothiazide (HYDRODIURIL) 25 MG tablet Take 1 tablet (25 mg total) by mouth daily. 90 tablet 3   Current Facility-Administered Medications  Medication Dose Route Frequency Provider Last Rate Last Dose  . 0.9 %  sodium chloride infusion  500 mL Intravenous Continuous Nandigam, Venia Minks, MD       Allergies:   Patient has no known allergies.   Social History:  The patient  reports that she has never smoked. She has never used smokeless tobacco. She reports that she drinks about 7.0 standard drinks of alcohol per week. She reports that she does not use drugs.   Family History:  The patient's family history includes Alzheimer's disease in her mother; Cancer in her cousin and paternal grandfather; Diabetes in her paternal aunt; Emphysema in her father; Heart Problems in her mother; Heart disease in her father and mother; Stomach cancer in her paternal grandfather; Stroke in her father.   ROS:  Please see the history of present illness.   Otherwise, review of systems are positive for none.   All other systems are reviewed and negative.   PHYSICAL EXAM: VS:  BP (!) 148/68   Pulse 69   Ht 5\' 2"  (1.575 m)   Wt 155 lb (70.3 kg)   BMI 28.35 kg/m  , BMI Body mass index is 28.35 kg/m. GEN: Well nourished, well developed, in no acute distress  HEENT: normal  Neck: no JVD, positive for left carotid bruits, or masses Cardiac: RRR; no murmurs, rubs, or gallops,no edema  Respiratory:  clear to auscultation bilaterally, normal work of breathing GI: soft,  nontender, nondistended, + BS MS: no deformity or atrophy  Skin: warm and dry, no rash Neuro:  Strength and sensation are intact Psych: euthymic mood, full affect   EKG:  EKG is not ordered today. EKG strips from her primary care physician showed normal sinus rhythm.   Recent Labs: No results found for requested labs within last 8760 hours. as per history of present illness  Lipid Panel    Component Value Date/Time   CHOL 176 03/15/2017 0844   TRIG 69 03/15/2017 0844   HDL 67 03/15/2017 0844   CHOLHDL 2.6 03/15/2017 0844   CHOLHDL 2.8 08/19/2015 0840   VLDL 21 08/19/2015 0840   LDLCALC 95 03/15/2017 0844   as per history of present illness   Wt Readings  from Last 3 Encounters:  04/23/18 155 lb (70.3 kg)  04/05/17 152 lb (68.9 kg)  01/26/17 149 lb (67.6 kg)    TTE: 07/21/2015 Study Conclusions  - Left ventricle: The cavity size was normal. Wall thickness was  normal. Systolic function was normal. The estimated ejection  fraction was in the range of 55% to 60%. Wall motion was normal;  there were no regional wall motion abnormalities. Doppler  parameters are consistent with abnormal left ventricular  relaxation (grade 1 diastolic dysfunction). - Mitral valve: There was mild regurgitation.  Impressions:  - Normal LV systolic function; grade 1 diastolic dysfunction; mild  MR and TR.  Exercise nuclear stress test: 07/21/2015  The left ventricular ejection fraction is hyperdynamic (>65%).  Nuclear stress EF: 75%.  Blood pressure demonstrated a hypertensive response to exercise.  The myocardial perfusion imaging is normal. No ischemia noted.  This is a low risk study.  Nondiagnostic EKG for ischemia. Downsloping ST segments noted in recovery lasting 12 minutes.  EKG-performed today 11/16/2016 shows normal sinus rhythm nonspecific ST-T wave abnormalities unchanged from prior.   ASSESSMENT AND PLAN:  1. Shortness of breath and dyspnea on exertion,  echocardiogram showed normal systolic LV ejection fraction with grade 1 diastolic dysfunction otherwise only mild mitral and tricuspid regurgitation.  A coronary CTA showed no evidence for CAD. Normal PFTs.  She is encouraged to exercise at least 15 minutes daily and try to walk a few times a week.  2. Hypertensive heart disease without heart failure-blood pressure controlled at home, will continue the same management.  3. Left carotid bruit - Normal carotid US.  4. Hyperlipidemia, well controlled on Crestor 10 mg daily with no side effects.  5.  Excessive sweating -we will check her labs including TSH free T3 and free T4 today.  I have discussed her medications with her pharmacist and potentially Effexor can be causing, she is advised to try to hold it for a few days and see if that makes any difference and if he has discussed with the prescribing provider.  Follow up in 1 year.  Signed, Ena Dawley, MD  04/23/2018 9:15 AM    [address], [place], [place]  [address] Phone: [phone]; Fax: [phone]

## 2018-04-23 NOTE — Patient Instructions (Signed)
Medication Instructions:   Your physician recommends that you continue on your current medications as directed. Please refer to the Current Medication list given to you today.  If you need a refill on your cardiac medications before your next appointment, please call your pharmacy.     Lab work:  TODAY--CMET, CBC W DIFF, TSH, FREE T3, AND FREE T4  If you have labs (blood work) drawn today and your tests are completely normal, you will receive your results only by: Marland Kitchen MyChart Message (if you have MyChart) OR . A paper copy in the mail If you have any lab test that is abnormal or we need to change your treatment, we will call you to review the results.     Follow-Up: At [facility], you and your health needs are our priority.  As part of our continuing mission to provide you with exceptional heart care, we have created designated Provider Care Teams.  These Care Teams include your primary Cardiologist (physician) and Advanced Practice Providers (APPs -  Physician Assistants and Nurse Practitioners) who all work together to provide you with the care you need, when you need it. You will need a follow up appointment in 1 years.  Please call our office 2 months in advance to schedule this appointment.  You may see Ena Dawley, MD or one of the following Advanced Practice Providers on your designated Care Team:   [place], PA-C Melina Copa, PA-C . Ermalinda Barrios, PA-C

## 2018-07-11 ENCOUNTER — Other Ambulatory Visit: Payer: Self-pay | Admitting: Cardiology

## 2018-07-11 DIAGNOSIS — E7849 Other hyperlipidemia: Secondary | ICD-10-CM

## 2018-07-11 DIAGNOSIS — I110 Hypertensive heart disease with heart failure: Secondary | ICD-10-CM

## 2018-07-11 DIAGNOSIS — E785 Hyperlipidemia, unspecified: Secondary | ICD-10-CM

## 2018-12-03 ENCOUNTER — Other Ambulatory Visit (HOSPITAL_COMMUNITY): Payer: Self-pay | Admitting: Neurosurgery

## 2018-12-03 ENCOUNTER — Other Ambulatory Visit: Payer: Self-pay | Admitting: Neurosurgery

## 2018-12-03 DIAGNOSIS — Z853 Personal history of malignant neoplasm of breast: Secondary | ICD-10-CM

## 2018-12-11 ENCOUNTER — Encounter (HOSPITAL_COMMUNITY)
Admission: RE | Admit: 2018-12-11 | Discharge: 2018-12-11 | Disposition: A | Discharge status: Home / Self Care | Payer: Medicare Other | Source: Ambulatory Visit | Attending: Neurosurgery | Admitting: Neurosurgery

## 2018-12-11 ENCOUNTER — Other Ambulatory Visit: Payer: Self-pay

## 2018-12-11 DIAGNOSIS — Z853 Personal history of malignant neoplasm of breast: Secondary | ICD-10-CM

## 2018-12-11 MED ORDER — TECHNETIUM TC 99M MEDRONATE IV KIT
21.9000 | PACK | Freq: Once | INTRAVENOUS | Status: AC
  Administered 2018-12-11: 21.9 via INTRAVENOUS

## 2018-12-14 ENCOUNTER — Other Ambulatory Visit (HOSPITAL_COMMUNITY): Payer: Medicare Other

## 2018-12-14 ENCOUNTER — Ambulatory Visit (HOSPITAL_COMMUNITY): Payer: Medicare Other

## 2019-04-25 ENCOUNTER — Encounter (INDEPENDENT_AMBULATORY_CARE_PROVIDER_SITE_OTHER): Payer: Self-pay

## 2019-04-25 ENCOUNTER — Telehealth: Payer: Self-pay | Admitting: Cardiology

## 2019-04-25 ENCOUNTER — Ambulatory Visit: Payer: Medicare Other | Admitting: Cardiology

## 2019-04-25 ENCOUNTER — Encounter: Payer: Self-pay | Admitting: Cardiology

## 2019-04-25 ENCOUNTER — Other Ambulatory Visit: Payer: Self-pay

## 2019-04-25 VITALS — BP 150/82 | HR 79 | Ht 62.0 in | Wt 151.0 lb

## 2019-04-25 DIAGNOSIS — I119 Hypertensive heart disease without heart failure: Secondary | ICD-10-CM

## 2019-04-25 DIAGNOSIS — E785 Hyperlipidemia, unspecified: Secondary | ICD-10-CM

## 2019-04-25 NOTE — Telephone Encounter (Signed)
Dr. Meda Coffee please advise. Pt had labs done, including lipids back in late July 2020, under scanned labs.  She is inquiring if she needs a lab appt?  You saw her today in clinic.

## 2019-04-25 NOTE — Progress Notes (Signed)
Patient ID: Allison Wells, female   DOB: [birth year], 78 y.o.   MRN: [id number]      Cardiology Office Note  Date:  04/25/2019   ID:  Allison Wells, DOB Aug 24, 1940, MRN [id number]  PCP:  Leonard Downing, MD  Cardiologist:   Ena Dawley, MD   Chief complaint: Shortness of breath, HTN.  History of Present Illness: Allison Wells is a 78 y.o. female who presents for evaluation of dyspnea on exertion. This is a very pleasant young-appearing 78 year old female who is a wife of my patient. The patient has no prior known history of heart problem. She was treated with 30 radiation therapy is an anastrozole for breast cancer 8 years ago. She has noticed shortness of breath since then but it has been worsening over the last year. On moderate exertion she also has noticed pressure in her chest. She denies any syncope. She feels occasional palpitations that are not associated with dizziness or syncope. In her family her mother had myocardial infarction in her 60s, father died of stroke, and grandfather had heart attack in his 37s. She eats fairly healthy and stays active but is not on a regular exercise program. She is currently using lovastatin for a straw management. Her cholesterol in May 2016 was triglycerides 107, HDL 73 and LDL 117.  01/26/2017 - 2 months follow-up, the patient underwent coronary CTA that showed a calcium score of 0 and no evidence of coronary artery disease. Because of findings of old granulomatous disease on chest CT Dr. Melvyn Novas ordered cardiopulmonary stress test that showed normal lung function, VO32max of 19 that represents 121% of predicted maximum. Her blood pressure rose to 240 mmHg. Losartan was increased to 50 mg daily. Her symptoms are otherwise unchanged she denies any recent chest pain continues to have exertional dyspnea. No actual edema orthopnea or proximal nocturnal dyspnea no palpitations.  04/05/2017 -3 months follow-up, patient has been switched to Crestor as her  cholesterol was 144 with pravastatin and it responded very well currently 95 with normal LFTs.  She has no side effects.  She exercises no chest pain or shortness of breath.  Her blood pressure is now well controlled.  She has no dizziness palpitations or falls.  04/23/2018 -this is 1 year follow-up, the patient has been doing great, she is tolerating her medications, denies any chest pain or shortness of breath at rest, but gets dyspneic on exertion.  She has a rowing machine at home but does not use it.  She is tolerating Crestor.  She has no lower extremity edema orthopnea proximal nocturnal dyspnea.  No palpitations or falls.  She is complaining of excessive sweating since this last summer.  04/25/2019 -patient is coming after 1 year, she has been doing well, she denies any chest pain, she has stable shortness of breath only when she walks stairs, she has been dealing with lower back pain for which she saw an orthopedic surgeon and no surgery was recommended as only arthritis was found.  She has no lower extremity edema, no palpitations or syncope.  She is tolerating her medications well.  Past Medical History:  Diagnosis Date  . Allergy   . Breast cancer ([place]) 09/27/2005   Right breast  . Bulging lumbar disc 2008  . Carpal tunnel syndrome on right   . Cataract   . Gallstones 1970  . GERD (gastroesophageal reflux disease)   . Hyperlipidemia   . Hypertension   . Osteoporosis   . Sebaceous cyst 2011  Past Surgical History:  Procedure Laterality Date  . APPENDECTOMY  1970  . BREAST LUMPECTOMY Right 09/27/2005  . BREAST MASS EXCISION Right 10/10/2005  . CARPAL TUNNEL RELEASE  2010  . CHOLECYSTECTOMY  1970  . COLONOSCOPY     2008  . TUBAL LIGATION  1970   Current Outpatient Medications  Medication Sig Dispense Refill  . Cholecalciferol (VITAMIN D) 2000 units CAPS Take 1 capsule by mouth daily.    . cyanocobalamin 2000 MCG tablet Take 2,500 mcg by mouth daily.    . cyclobenzaprine  (FLEXERIL) 10 MG tablet Take 10 mg by mouth every 8 (eight) hours as needed.    . diclofenac (VOLTAREN) 75 MG EC tablet Take 75 mg by mouth 2 (two) times daily.    . hydrochlorothiazide (HYDRODIURIL) 25 MG tablet TAKE ONE TABLET BY MOUTH ONE TIME DAILY  90 tablet 3  . HYDROcodone-acetaminophen (NORCO/VICODIN) 5-325 MG tablet Take 0.5 tablets by mouth every 6 (six) hours as needed.    . Loratadine 10 MG CAPS Take 1 capsule by mouth daily.    Marland Kitchen losartan (COZAAR) 50 MG tablet TAKE ONE TABLET BY MOUTH ONE TIME DAILY  90 tablet 3  . omeprazole (PRILOSEC) 20 MG capsule Take 20 mg by mouth daily.    . polyethylene glycol powder (MIRALAX) powder Take 17 g by mouth daily.    . rosuvastatin (CRESTOR) 10 MG tablet Take 1 tablet (10 mg total) by mouth daily. 90 tablet 3   Current Facility-Administered Medications  Medication Dose Route Frequency Provider Last Rate Last Dose  . 0.9 %  sodium chloride infusion  500 mL Intravenous Continuous Nandigam, Venia Minks, MD       Allergies:   Patient has no known allergies.   Social History:  The patient  reports that she has never smoked. She has never used smokeless tobacco. She reports current alcohol use of about 7.0 standard drinks of alcohol per week. She reports that she does not use drugs.   Family History:  The patient's family history includes Alzheimer's disease in her mother; Cancer in her cousin and paternal grandfather; Diabetes in her paternal aunt; Emphysema in her father; Heart Problems in her mother; Heart disease in her father and mother; Stomach cancer in her paternal grandfather; Stroke in her father.   ROS:  Please see the history of present illness.   Otherwise, review of systems are positive for none.   All other systems are reviewed and negative.   PHYSICAL EXAM: VS:  BP (!) 150/82   Pulse 79   Ht 5\' 2"  (1.575 m)   Wt 151 lb (68.5 kg)   SpO2 98%   BMI 27.62 kg/m  , BMI Body mass index is 27.62 kg/m. GEN: Well nourished, well  developed, in no acute distress  HEENT: normal  Neck: no JVD, positive for left carotid bruits, or masses Cardiac: RRR; no murmurs, rubs, or gallops,no edema  Respiratory:  clear to auscultation bilaterally, normal work of breathing GI: soft, nontender, nondistended, + BS MS: no deformity or atrophy  Skin: warm and dry, no rash Neuro:  Strength and sensation are intact Psych: euthymic mood, full affect   EKG:  EKG is not ordered today. EKG strips from her primary care physician showed normal sinus rhythm.   Recent Labs: No results found for requested labs within last 8760 hours. as per history of present illness  Lipid Panel    Component Value Date/Time   CHOL 176 03/15/2017 0844   TRIG 69  03/15/2017 0844   HDL 67 03/15/2017 0844   CHOLHDL 2.6 03/15/2017 0844   CHOLHDL 2.8 08/19/2015 0840   VLDL 21 08/19/2015 0840   LDLCALC 95 03/15/2017 0844   as per history of present illness   Wt Readings from Last 3 Encounters:  04/25/19 151 lb (68.5 kg)  04/23/18 155 lb (70.3 kg)  04/05/17 152 lb (68.9 kg)    TTE: 07/21/2015 Study Conclusions  - Left ventricle: The cavity size was normal. Wall thickness was  normal. Systolic function was normal. The estimated ejection  fraction was in the range of 55% to 60%. Wall motion was normal;  there were no regional wall motion abnormalities. Doppler  parameters are consistent with abnormal left ventricular  relaxation (grade 1 diastolic dysfunction). - Mitral valve: There was mild regurgitation.  Impressions:  - Normal LV systolic function; grade 1 diastolic dysfunction; mild  MR and TR.  Exercise nuclear stress test: 07/21/2015  The left ventricular ejection fraction is hyperdynamic (>65%).  Nuclear stress EF: 75%.  Blood pressure demonstrated a hypertensive response to exercise.  The myocardial perfusion imaging is normal. No ischemia noted.  This is a low risk study.  Nondiagnostic EKG for ischemia. Downsloping  ST segments noted in recovery lasting 12 minutes.  EKG-performed today 11/16/2016 shows normal sinus rhythm nonspecific ST-T wave abnormalities unchanged from prior.   ASSESSMENT AND PLAN:  1. Shortness of breath and dyspnea on exertion, echocardiogram showed normal systolic LV ejection fraction with grade 1 diastolic dysfunction otherwise only mild mitral and tricuspid regurgitation.  A coronary CTA showed no evidence for CAD. Normal PFTs.  She is again encouraged to start regular exercise.    2. Hypertensive heart disease without heart failure-her blood pressure is elevated today, she is advised to send this diary of her blood pressures at home.  3.  Lower back pain, we will give a trial of holding statins for 2 weeks, if improvement will try to switch, if no improvement we will restart she is advised to find a physical therapist. 3. Left carotid bruit - Normal carotid US.  4. Hyperlipidemia, well controlled on Crestor 10 mg daily, management as about.  We will obtain labs including CBC, CMP, TSH and lipids  Follow up in 1 year.  Signed, Ena Dawley, MD  04/25/2019 11:28 AM    [facility] [address], [place], [place]  [address] Phone: [phone]; Fax: [phone]

## 2019-04-25 NOTE — Telephone Encounter (Signed)
No new labs needed, her LDL and triglycerides are borderline, I would focus more on healthy diet and regular exercise.

## 2019-04-25 NOTE — Patient Instructions (Addendum)
Medication Instructions:   HOLD YOUR ROSUVASTATIN FOR 2 WEEKS, THEN CALL THE OFFICE AT [phone] TO REPORT TO THE OPERATORS IF YOUR SYMPTOMS IMPROVED OR NOT  *If you need a refill on your cardiac medications before your next appointment, please call your pharmacy*   Follow-Up: At [facility], you and your health needs are our priority.  As part of our continuing mission to provide you with exceptional heart care, we have created designated Provider Care Teams.  These Care Teams include your primary Cardiologist (physician) and Advanced Practice Providers (APPs -  Physician Assistants and Nurse Practitioners) who all work together to provide you with the care you need, when you need it.  Your next appointment:   12 month(s)  The format for your next appointment:   In Person  Provider:   Ena Dawley, MD  Other Instructions  PLEASE HOLD YOUR ROSUVASTATIN FOR 2 WEEKS THEN CALL THE OFFICE AT [phone] TO REPORT TO THE OPERATORS IF YOUR SYMPTOMS IMPROVE OR NOT  PLEASE TAKE YOUR BLOOD PRESSURE AND HEART RATE FOR 2 WEEKS AND CALL THE RESULTS INTO OUR OFFICE, SAME TIME YOU CALL ABOUT YOUR HELD ROSUVASTATIN.  YOU WILL NEED TO CALL [phone] AND GIVE YOUR BP/HR RESULTS AND TELL HOW YOUR FEELING SINCE OFF ROSUVASTATIN, TO OUR OPERATORS.

## 2019-04-25 NOTE — Telephone Encounter (Signed)
Patient was seen today and was calling in regards to wanting to know if Dr. Meda Coffee would like her to schedule a lab appointment. Patient stated a voicemail can be left if she isn't able to answer call. Please advise.

## 2019-04-25 NOTE — Telephone Encounter (Signed)
Spoke with the pts Husband (on [place]) and informed him that per Dr Meda Coffee, the pt will not need new labs, her LDL and triglycerides are borderline, and she recommends that she focus more on healthy diet and regular exercise.  Heart healthy diet education provided to the pts Husband.  Spouse verbalized understanding and will endorse this information to the pt, when she returns home from the store.

## 2019-05-09 ENCOUNTER — Telehealth: Payer: Self-pay | Admitting: Cardiology

## 2019-05-09 MED ORDER — LOSARTAN POTASSIUM 100 MG PO TABS: 100.0000 mg | ORAL_TABLET | Freq: Every day | ORAL | 1 refills | Status: DC

## 2019-05-09 NOTE — Telephone Encounter (Signed)
Left the pt a message to call the office back to endorse statin recommendations per Dr Meda Coffee.

## 2019-05-09 NOTE — Telephone Encounter (Signed)
Please try pravastatin 10 mg po daily

## 2019-05-09 NOTE — Telephone Encounter (Signed)
I will decrease her losartan 100 mg daily to be taken at night and hydrochlorothiazide 25 milligrams in the morning.

## 2019-05-09 NOTE — Telephone Encounter (Signed)
Notified the pt that per Dr Meda Coffee, she recommends that we increase her losartan to 100 mg po daily, take this at night, and continue her HCTZ 25 mg po daily, take this in the mornings.  Confirmed the pharmacy of choice with the pt.  Pt verbalized understanding and agrees with this plan.

## 2019-05-09 NOTE — Telephone Encounter (Signed)
Will forward message to Dr Meda Coffee for review of B/P Pt currently taking HCTZ 25 mg every day and Losartan 50 mg every day for B/P .Adonis Housekeeper

## 2019-05-09 NOTE — Telephone Encounter (Signed)
New Message   Patient is calling to provide the last five blood pressure readings.    12/3  146/66    HR 75 12/2   136/70   HR 99 12/1   146/85   HR 89 11/30  125/72  HR 81 11/29  139/73  HR 66 (patient states at one time her HR went up to 101)   Patient states that since being off the crestor she is feeling better but she is having some stiffness. The stiffness is her neck still but the back is much much better.

## 2019-05-09 NOTE — Telephone Encounter (Signed)
Dr. Meda Coffee, also in this message pt was calling to let you know that she is feeling much better, and her back ache and joint aches have improved since you advised her to HOLD Crestor.  Please advise on if she should continue holding this medication, or be switched to a different regimen?

## 2019-05-10 MED ORDER — PRAVASTATIN SODIUM 10 MG PO TABS: 10.0000 mg | ORAL_TABLET | Freq: Every day | ORAL | 1 refills | Status: DC

## 2019-05-10 NOTE — Telephone Encounter (Signed)
Follow Up:       Returning Ivy's call from yesterday. 

## 2019-05-10 NOTE — Telephone Encounter (Signed)
Spoke with the pt and informed her that per Dr. Meda Coffee, we will stop her rosuvastatin, update this in her allergies as an intolerance, and start her on Pravastatin 10 mg po daily.  Confirmed the pharmacy of choice with the pt.  Informed the pt that I will send in only a month supply to her pharmacy, to make sure she tolerates this med appropriately. Pt verbalized understanding and agrees with this plan.

## 2019-05-20 IMAGING — CT CT HEART MORP W/ CTA COR W/ SCORE W/ CA W/CM &/OR W/O CM
1 of 6 series · 13 of 20 positions shown, 17 images · non-contrast
Comparison: None.

CLINICAL DATA: 76 -year-old female with NAZARETH and family h/o
premature CAD.

EXAM:
Cardiac/Coronary  CT
TECHNIQUE: The patient was scanned on a Philips 256 scanner.

[Series 10: corcta 0.6 i26f 2 40 - 80 % · axial · 0.36mm/px · z∈[+1072,+1199]mm · 13 of 3321 slices shown, 17 images]
[im 238/3321  vessel]
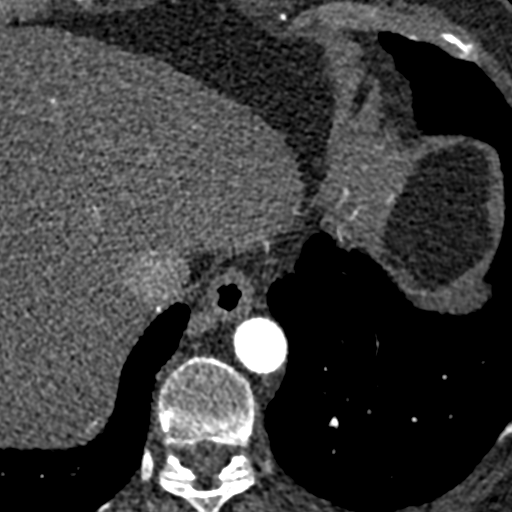
[im 238/3321  lung]
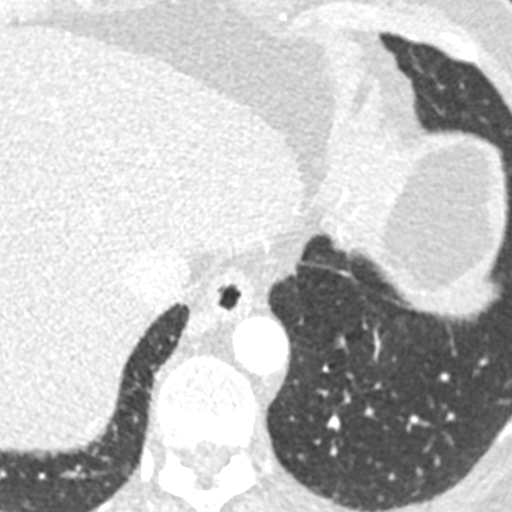
[im 475/3321  vessel]
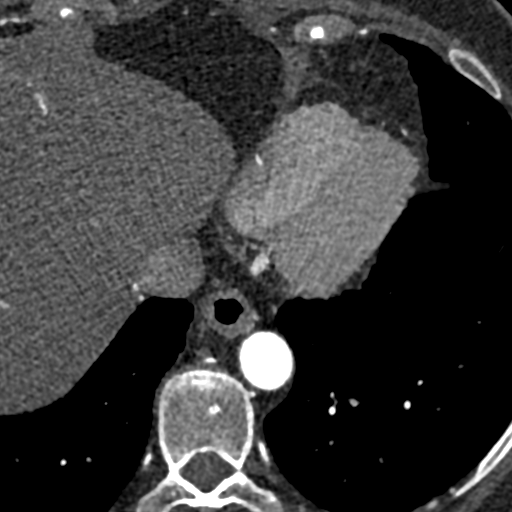
[im 712/3321  vessel]
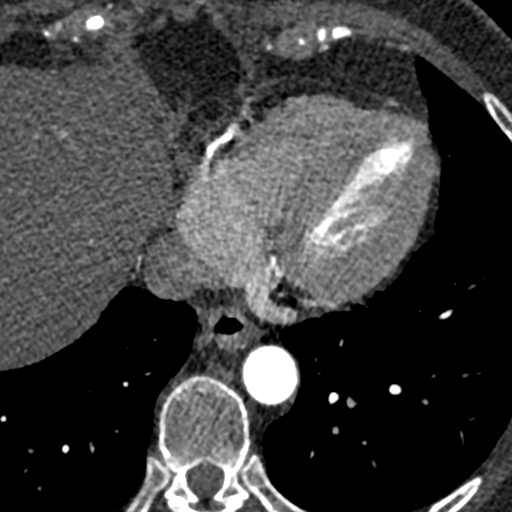
[im 949/3321  vessel]
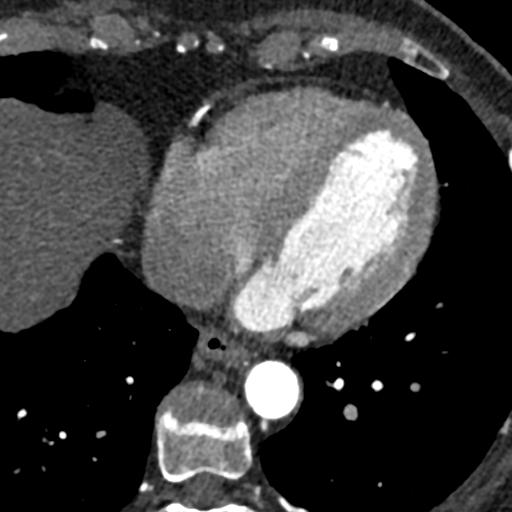
[im 1186/3321  vessel]
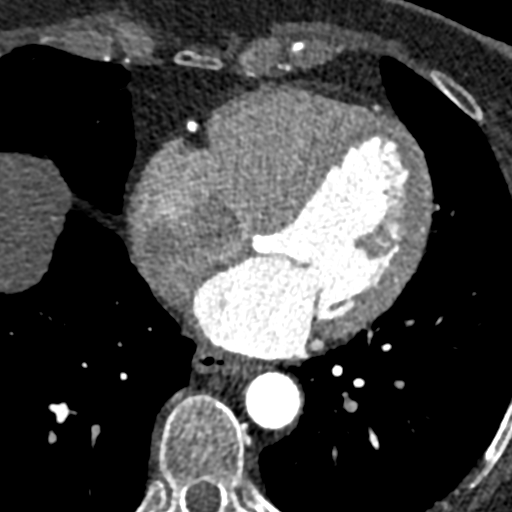
[im 1186/3321  lung]
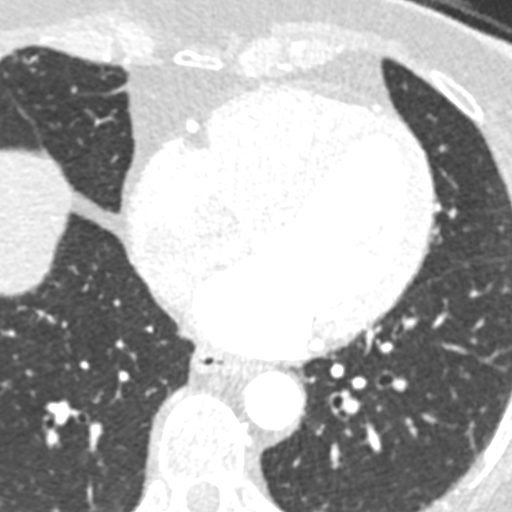
[im 1423/3321  vessel]
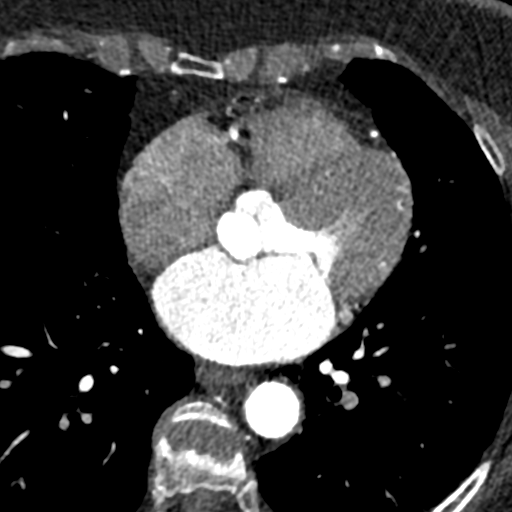
[im 1661/3321  vessel]
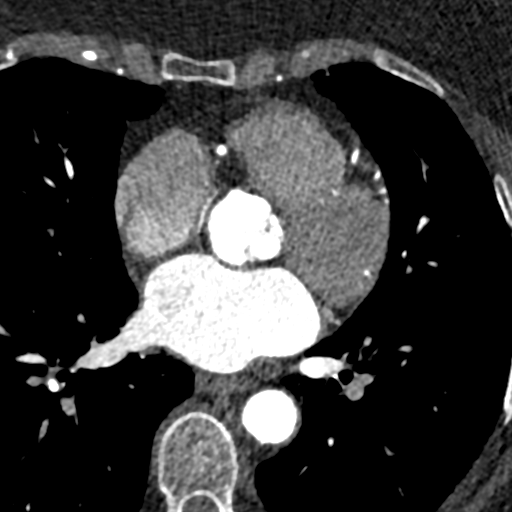
[im 1898/3321  vessel]
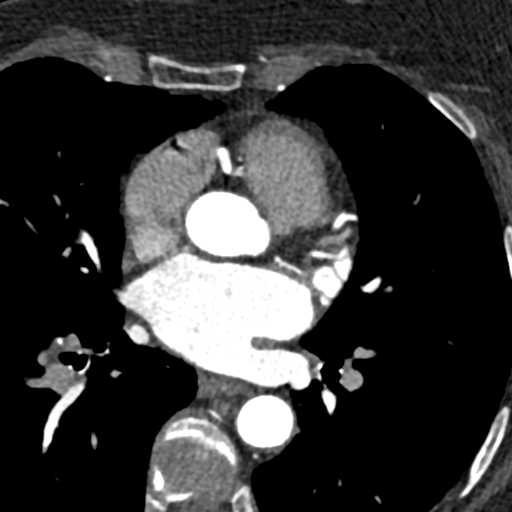
[im 2135/3321  vessel]
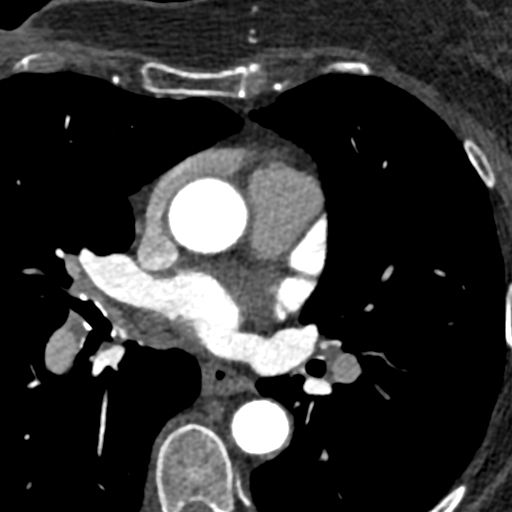
[im 2135/3321  lung]
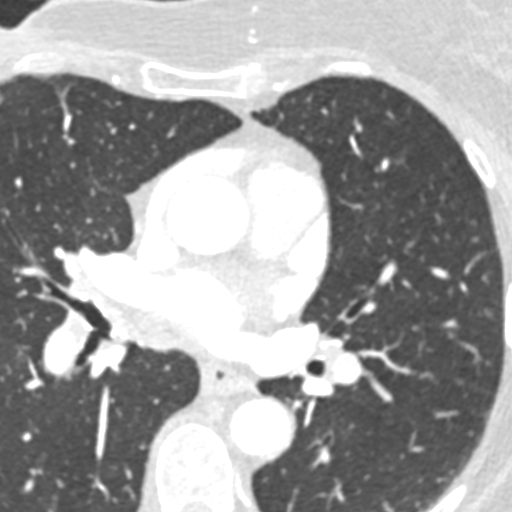
[im 2372/3321  vessel]
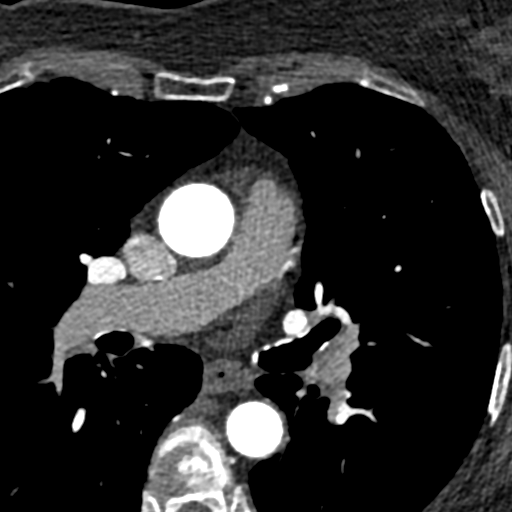
[im 2609/3321  vessel]
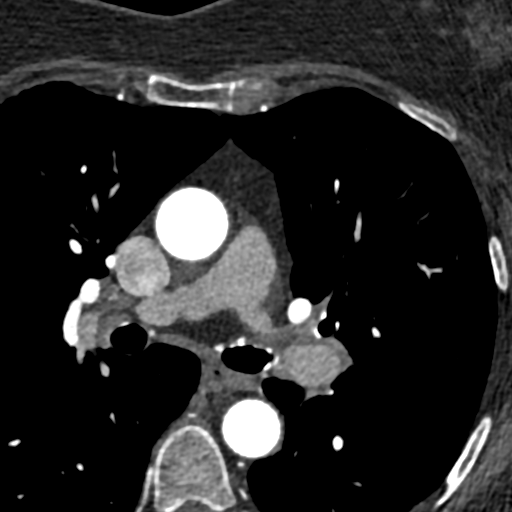
[im 2846/3321  vessel]
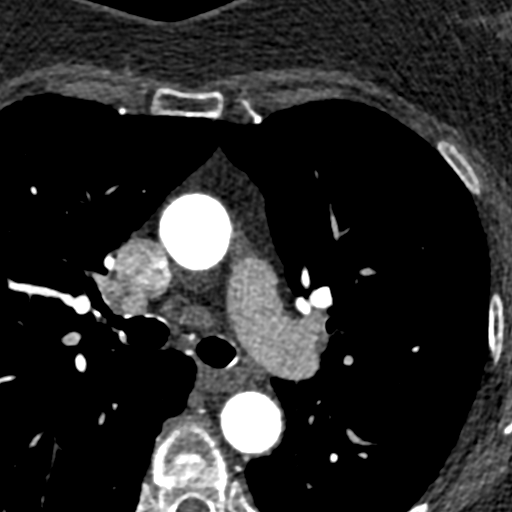
[im 3083/3321  vessel]
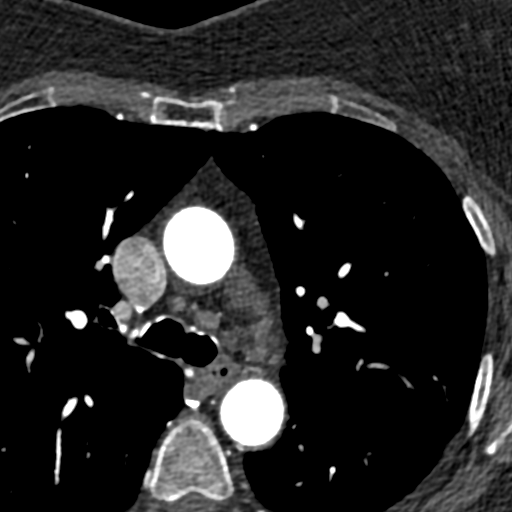
[im 3083/3321  lung]
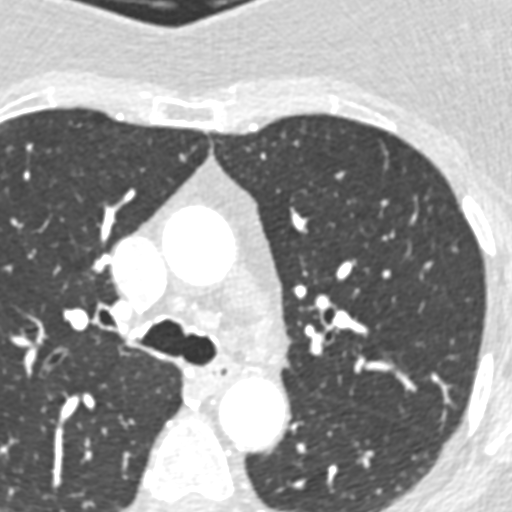

[13 of 20 positions shown; findings below may reference images not displayed]

FINDINGS: A 120 kV prospective scan was triggered in the descending thoracic
aorta at 111 HU's. Axial non-contrast 3 mm slices were carried out
through the heart. The data set was analyzed on a dedicated work
station and scored using the Agatson method. Gantry rotation speed
was 270 msecs and collimation was .9 mm. 5 mg of iv metoprolol and
0.8 mg of sl NTG was given. The 3D data set was reconstructed in 5%
intervals of the 67-82 % of the R-R cycle. Diastolic phases were
analyzed on a dedicated work station using MPR, MIP and VRT modes.
The patient received 80 cc of contrast.

Aorta: Normal size. Mild calcifications and atherosclerotic plaque.
No dissection.

Aortic Valve:  Trileaflet.  No calcifications.

Coronary Arteries:  Normal coronary origin.  Right dominance.

RCA is a large dominant artery that gives rise to an acute marginal
branch, PDA and PLVB. There is minimal non-obstructive non-calcified
plaque.

Left main is a large artery that gives rise to LAD and LCX arteries.

LAD is a medium size vessel that has minimal non-obstructive
non-calcified plaque in the distal portion.

D1 and D2 are small and have no plaque.

LCX is a very small non-dominant artery that has no plaque.

Other findings:

Normal pulmonary vein drainage into the left atrium.

Normal let atrial appendage without a thrombus.

Normal pulmonary artery size.
IMPRESSION: 1. Coronary calcium score of 0. This was 0 percentile for age and
sex matched control.

2. Normal coronary origin with right dominance.

3. Minimal non-obstructive CAD.

Relindas Auad

EXAM:
OVER-READ INTERPRETATION  CT CHEST

The following report is an over-read performed by radiologist Dr.
Marc-Jan Josepha [[email or web address]] on 11/21/2016. This over-read
does not include interpretation of cardiac or coronary anatomy or
pathology. The coronary CTA interpretation by the cardiologist is
attached.
FINDINGS: Cardiovascular: Aortic calcifications noted. Visualized aorta normal
caliber. Heart is normal size.

Mediastinum/Nodes: Scattered calcified right hilar lymph nodes. No
adenopathy in the lower mediastinum or hila.

Lungs/Pleura: Visualized lungs clear.  No effusions.

Upper Abdomen: Imaging into the upper abdomen shows no acute
findings.

Musculoskeletal: Chest wall soft tissues are unremarkable. No acute
bony abnormality.
IMPRESSION: Evidence of old granulomatous disease with calcified right hilar
lymph nodes.

No acute or significant extracardiac abnormality.

## 2019-06-11 ENCOUNTER — Other Ambulatory Visit: Payer: Self-pay | Admitting: Oncology

## 2019-06-24 ENCOUNTER — Ambulatory Visit: Payer: Medicare Other | Admitting: Cardiology

## 2019-07-02 ENCOUNTER — Other Ambulatory Visit: Payer: Self-pay | Admitting: Cardiology

## 2019-07-11 ENCOUNTER — Other Ambulatory Visit: Payer: Self-pay | Admitting: Cardiology

## 2019-07-11 DIAGNOSIS — E7849 Other hyperlipidemia: Secondary | ICD-10-CM

## 2019-07-11 DIAGNOSIS — I110 Hypertensive heart disease with heart failure: Secondary | ICD-10-CM

## 2019-12-07 ENCOUNTER — Other Ambulatory Visit: Payer: Self-pay | Admitting: Cardiology

## 2019-12-11 ENCOUNTER — Telehealth: Payer: Self-pay | Admitting: Cardiology

## 2019-12-11 NOTE — Telephone Encounter (Signed)
*  STAT* If patient is at the pharmacy, call can be transferred to refill team.   1. Which medications need to be refilled? (please list name of each medication and dose if known) losartan (COZAAR) 100 MG tablet and Rosuvastatin  2. Which pharmacy/location (including street and city if local pharmacy) is medication to be sent to? [facility] # [place], [place]  3. Do they need a 30 day or 90 day supply? [place]

## 2019-12-11 NOTE — Telephone Encounter (Signed)
Called pt to inform her that her medications were already sent to her pharmacy as requested. I informed pt that if she has any other problems, questions or concerns, to give our office a call. Pt verbalized understanding.

## 2020-05-22 ENCOUNTER — Other Ambulatory Visit: Payer: Self-pay | Admitting: Cardiology

## 2020-06-29 ENCOUNTER — Encounter: Payer: Self-pay | Admitting: *Deleted

## 2020-06-29 ENCOUNTER — Ambulatory Visit (INDEPENDENT_AMBULATORY_CARE_PROVIDER_SITE_OTHER): Payer: Medicare Other

## 2020-06-29 ENCOUNTER — Encounter: Payer: Self-pay | Admitting: Cardiology

## 2020-06-29 ENCOUNTER — Ambulatory Visit: Payer: Medicare Other | Admitting: Cardiology

## 2020-06-29 ENCOUNTER — Other Ambulatory Visit: Payer: Self-pay

## 2020-06-29 VITALS — BP 136/76 | HR 67 | Ht 62.0 in | Wt 152.0 lb

## 2020-06-29 DIAGNOSIS — R002 Palpitations: Secondary | ICD-10-CM

## 2020-06-29 DIAGNOSIS — I110 Hypertensive heart disease with heart failure: Secondary | ICD-10-CM | POA: Diagnosis not present

## 2020-06-29 DIAGNOSIS — E7849 Other hyperlipidemia: Secondary | ICD-10-CM | POA: Diagnosis not present

## 2020-06-29 MED ORDER — LOSARTAN POTASSIUM 100 MG PO TABS: 100.0000 mg | ORAL_TABLET | Freq: Every day | ORAL | 3 refills | Status: DC

## 2020-06-29 MED ORDER — HYDROCHLOROTHIAZIDE 25 MG PO TABS: 25.0000 mg | ORAL_TABLET | Freq: Every day | ORAL | 3 refills | Status: DC

## 2020-06-29 MED ORDER — PRAVASTATIN SODIUM 10 MG PO TABS: 10.0000 mg | ORAL_TABLET | Freq: Every day | ORAL | 3 refills | Status: DC

## 2020-06-29 NOTE — Patient Instructions (Signed)
Medication Instructions:   Your physician recommends that you continue on your current medications as directed. Please refer to the Current Medication list given to you today.  *If you need a refill on your cardiac medications before your next appointment, please call your pharmacy*   Testing/Procedures:  ZIO XT- Long Term Monitor Instructions   Your physician has requested you wear your ZIO patch monitor_7______days.   This is a single patch monitor.  [employer] supplies one patch monitor per enrollment.  Additional stickers are not available.   Please do not apply patch if you will be having a Nuclear Stress Test, Echocardiogram, Cardiac CT, MRI, or Chest Xray during the time frame you would be wearing the monitor. The patch cannot be worn during these tests.  You cannot remove and re-apply the ZIO XT patch monitor.   Your ZIO patch monitor will be sent USPS Priority mail from [facility] directly to your home address. The monitor may also be mailed to a PO BOX if home delivery is not available.   It may take 3-5 days to receive your monitor after you have been enrolled.   Once you have received you monitor, please review enclosed instructions.  Your monitor has already been registered assigning a specific monitor serial # to you.   Applying the monitor   Shave hair from upper left chest.   Hold abrader disc by orange tab.  Rub abrader in 40 strokes over left upper chest as indicated in your monitor instructions.   Clean area with 4 enclosed alcohol pads .  Use all pads to assure are is cleaned thoroughly.  Let dry.   Apply patch as indicated in monitor instructions.  Patch will be place under collarbone on left side of chest with arrow pointing upward.   Rub patch adhesive wings for 2 minutes.Remove white label marked "1".  Remove white label marked "2".  Rub patch adhesive wings for 2 additional minutes.   While looking in a mirror, press and release button in center of  patch.  A small green light will flash 3-4 times .  This will be your only indicator the monitor has been turned on.     Do not shower for the first 24 hours.  You may shower after the first 24 hours.   Press button if you feel a symptom. You will hear a small click.  Record Date, Time and Symptom in the Patient Log Book.   When you are ready to remove patch, follow instructions on last 2 pages of Patient Log Book.  Stick patch monitor onto last page of Patient Log Book.   Place Patient Log Book in [place] box.  Use locking tab on box and tape box closed securely.  The [employer] has [employer] on it.  Please place in mailbox as soon as possible.  Your physician should have your test results approximately 7 days after the monitor has been mailed back to [facility].   Call [place] at [phone] if you have questions regarding your ZIO XT patch monitor.  Call them immediately if you see an orange light blinking on your monitor.   If your monitor falls off in less than 4 days contact our Monitor department at [phone].  If your monitor becomes loose or falls off after 4 days call [employer] at [phone] for suggestions on securing your monitor.    Follow-Up: At [facility], you and your health needs are our priority.  As part  of our continuing mission to provide you with exceptional heart care, we have created designated Provider Care Teams.  These Care Teams include your primary Cardiologist (physician) and Advanced Practice Providers (APPs -  Physician Assistants and Nurse Practitioners) who all work together to provide you with the care you need, when you need it.  We recommend signing up for the patient portal called "MyChart".  Sign up information is provided on this After Visit Summary.  MyChart is used to connect with patients for Virtual Visits (Telemedicine).  Patients are able to view lab/test results, encounter notes, upcoming appointments,  etc.  Non-urgent messages can be sent to your provider as well.   To learn more about what you can do with MyChart, go to [email or web address].    Your next appointment:   1 year(s)  The format for your next appointment:   In Person  Provider:   Ena Dawley, MD

## 2020-06-29 NOTE — Progress Notes (Signed)
Patient ID: Allison Wells, female   DOB: 15-Mar-1941, 80 y.o.   MRN: [id number] Patient enrolled for [employer] to ship a 7 day ZIO XT long term holter monitor to her home.

## 2020-06-29 NOTE — Progress Notes (Signed)
Patient ID: Allison Wells, female   DOB: [birth year], 80 y.o.   MRN: [id number]      Cardiology Office Note  Date:  06/29/2020   ID:  Allison Wells, Allison Wells [birth year], MRN [id number]  PCP:  Leonard Downing, MD  Cardiologist:   Ena Dawley, MD   Chief complaint: Palpitations  History of Present Illness: Allison Wells is a 80 y.o. female who presents for evaluation of dyspnea on exertion. This is a very pleasant young-appearing 80 year old female who is a wife of my patient. The patient has no prior known history of heart problem. She was treated with 30 radiation therapy is an anastrozole for breast cancer 8 years ago. She has noticed shortness of breath since then but it has been worsening over the last year. She patient underwent coronary CTA in 2018 that showed a calcium score of 0 and no evidence of coronary artery disease. Because of findings of old granulomatous disease on chest CT Dr. Melvyn Novas ordered cardiopulmonary stress test that showed normal lung function, VO42max of 19 that represents 121% of predicted maximum. Her blood pressure rose to 240 mmHg. Losartan was increased to 50 mg daily. Her symptoms are otherwise unchanged she denies any recent chest pain continues to have exertional dyspnea. No actual edema orthopnea or proximal nocturnal dyspnea no palpitations. In the past she was intolerant to Crestor but is tolerating pravastatin well.  She is coming for annual follow-up, she states that with Covid and her husband's cancer they have been staying home a lot, she walks a lot around the house that has lot of stairs and denies any chest pain or shortness of breath.  She has noticed palpitations that started all of a sudden usually when she is rushing or she is in stress and they can last up to 10 minutes and slowly resolved.  Sometimes she has to lay down for palpitations to go away.  They are not associated with chest pain or shortness of breath or dizziness.  No syncope.  She is  tolerating her medications well, she has been well compliant with her meds, she recently had her blood work done at her primary care office.  Past Medical History:  Diagnosis Date  . Allergy   . Breast cancer ([place]) 09/27/2005   Right breast  . Bulging lumbar disc 2008  . Carpal tunnel syndrome on right   . Cataract   . Gallstones 1970  . GERD (gastroesophageal reflux disease)   . Hyperlipidemia   . Hypertension   . Osteoporosis   . Sebaceous cyst 2011   Past Surgical History:  Procedure Laterality Date  . APPENDECTOMY  1970  . BREAST LUMPECTOMY Right 09/27/2005  . BREAST MASS EXCISION Right 10/10/2005  . CARPAL TUNNEL RELEASE  2010  . CHOLECYSTECTOMY  1970  . COLONOSCOPY     2008  . TUBAL LIGATION  1970   Current Outpatient Medications  Medication Sig Dispense Refill  . Cholecalciferol (VITAMIN D) 2000 units CAPS Take 1 capsule by mouth daily.    . cyanocobalamin 2000 MCG tablet Take 2,500 mcg by mouth daily.    . cyclobenzaprine (FLEXERIL) 10 MG tablet Take 10 mg by mouth every 8 (eight) hours as needed.    . hydrochlorothiazide (HYDRODIURIL) 25 MG tablet Take 1 tablet (25 mg total) by mouth daily. 90 tablet 3  . HYDROcodone-acetaminophen (NORCO/VICODIN) 5-325 MG tablet Take 0.5 tablets by mouth every 6 (six) hours as needed.    . Loratadine 10 MG CAPS Take  1 capsule by mouth daily.    Marland Kitchen losartan (COZAAR) 100 MG tablet Take 1 tablet (100 mg total) by mouth at bedtime. Please keep upcoming appointment in jan 2022 for future refills. Thank you 90 tablet 0  . omeprazole (PRILOSEC) 20 MG capsule Take 20 mg by mouth daily.    . polyethylene glycol powder (GLYCOLAX/MIRALAX) 17 GM/SCOOP powder Take 17 g by mouth daily.    . pravastatin (PRAVACHOL) 10 MG tablet Take 1 tablet (10 mg total) by mouth daily. 90 tablet 1   Current Facility-Administered Medications  Medication Dose Route Frequency Provider Last Rate Last Admin  . 0.9 %  sodium chloride infusion  500 mL Intravenous  Continuous Nandigam, Venia Minks, MD       Allergies:   Rosuvastatin   Social History:  The patient  reports that she has never smoked. She has never used smokeless tobacco. She reports current alcohol use of about 7.0 standard drinks of alcohol per week. She reports that she does not use drugs.   Family History:  The patient's family history includes Alzheimer's disease in her mother; Cancer in her cousin and paternal grandfather; Diabetes in her paternal aunt; Emphysema in her father; Heart Problems in her mother; Heart disease in her father and mother; Stomach cancer in her paternal grandfather; Stroke in her father.   ROS:  Please see the history of present illness.   Otherwise, review of systems are positive for none.   All other systems are reviewed and negative.   PHYSICAL EXAM: VS:  BP 136/76   Pulse 67   Ht 5\' 2"  (1.575 m)   Wt 152 lb (68.9 kg)   SpO2 98%   BMI 27.80 kg/m  , BMI Body mass index is 27.8 kg/m. GEN: Well nourished, well developed, in no acute distress  HEENT: normal  Neck: no JVD, positive for left carotid bruits, or masses Cardiac: RRR; no murmurs, rubs, or gallops,no edema  Respiratory:  clear to auscultation bilaterally, normal work of breathing GI: soft, nontender, nondistended, + BS MS: no deformity or atrophy  Skin: warm and dry, no rash Neuro:  Strength and sensation are intact Psych: euthymic mood, full affect   EKG:  EKG is ordered today.  It shows normal sinus rhythm, heart rate 70 bpm, normal EKG unchanged from prior.  This was personally reviewed. EKG strips from her primary care physician showed normal sinus rhythm.   Recent Labs: No results found for requested labs within last 8760 hours. as per history of present illness  Lipid Panel    Component Value Date/Time   CHOL 176 03/15/2017 0844   TRIG 69 03/15/2017 0844   HDL 67 03/15/2017 0844   CHOLHDL 2.6 03/15/2017 0844   CHOLHDL 2.8 08/19/2015 0840   VLDL 21 08/19/2015 0840   LDLCALC  95 03/15/2017 0844   as per history of present illness   Wt Readings from Last 3 Encounters:  06/29/20 152 lb (68.9 kg)  04/25/19 151 lb (68.5 kg)  04/23/18 155 lb (70.3 kg)    TTE: 07/21/2015 Study Conclusions  - Left ventricle: The cavity size was normal. Wall thickness was  normal. Systolic function was normal. The estimated ejection  fraction was in the range of 55% to 60%. Wall motion was normal;  there were no regional wall motion abnormalities. Doppler  parameters are consistent with abnormal left ventricular  relaxation (grade 1 diastolic dysfunction). - Mitral valve: There was mild regurgitation.  Impressions:  - Normal LV systolic function; grade  1 diastolic dysfunction; mild  MR and TR.  Exercise nuclear stress test: 07/21/2015  The left ventricular ejection fraction is hyperdynamic (>65%).  Nuclear stress EF: 75%.  Blood pressure demonstrated a hypertensive response to exercise.  The myocardial perfusion imaging is normal. No ischemia noted.  This is a low risk study.  Nondiagnostic EKG for ischemia. Downsloping ST segments noted in recovery lasting 12 minutes.  EKG-performed today 11/16/2016 shows normal sinus rhythm nonspecific ST-T wave abnormalities unchanged from prior.   ASSESSMENT AND PLAN:  1. Shortness of breath and dyspnea on exertion, echocardiogram showed normal systolic LV ejection fraction with grade 1 diastolic dysfunction otherwise only mild mitral and tricuspid regurgitation.  A coronary CTA showed no evidence for CAD. Normal PFTs.  She is again encouraged to start regular exercise.    2. Hypertensive heart disease without heart failure-repeat blood pressure 126/72 mmHg.  We will continue same dose of losartan and hydrochlorothiazide.  3. Hyperlipidemia, tolerating pravastatin well, we will obtain labs from her PCP.  4.  Palpitations -we will obtain 7-day Zio patch monitor.  Follow up in 1 year.  Signed, Ena Dawley, MD   06/29/2020 8:47 AM    [address], [place], [place]  [address] Phone: [phone]; Fax: [phone]

## 2020-07-01 DIAGNOSIS — R002 Palpitations: Secondary | ICD-10-CM | POA: Diagnosis not present

## 2021-06-21 ENCOUNTER — Other Ambulatory Visit: Payer: Self-pay

## 2021-06-21 MED ORDER — PRAVASTATIN SODIUM 10 MG PO TABS: 10.0000 mg | ORAL_TABLET | Freq: Every day | ORAL | 0 refills | Status: DC

## 2021-06-21 NOTE — Addendum Note (Signed)
Addended by: Carter Kitten D on: 06/21/2021 11:14 AM   Modules accepted: Orders

## 2021-07-01 ENCOUNTER — Other Ambulatory Visit: Payer: Self-pay

## 2021-07-01 DIAGNOSIS — I110 Hypertensive heart disease with heart failure: Secondary | ICD-10-CM

## 2021-07-01 DIAGNOSIS — E7849 Other hyperlipidemia: Secondary | ICD-10-CM

## 2021-07-01 MED ORDER — HYDROCHLOROTHIAZIDE 25 MG PO TABS: 25.0000 mg | ORAL_TABLET | Freq: Every day | ORAL | 0 refills | Status: DC

## 2021-07-22 ENCOUNTER — Other Ambulatory Visit: Payer: Self-pay | Admitting: *Deleted

## 2021-07-22 MED ORDER — PRAVASTATIN SODIUM 10 MG PO TABS: 10.0000 mg | ORAL_TABLET | Freq: Every day | ORAL | 0 refills | Status: DC

## 2021-07-28 ENCOUNTER — Telehealth: Payer: Self-pay | Admitting: Cardiology

## 2021-07-28 ENCOUNTER — Other Ambulatory Visit: Payer: Self-pay | Admitting: *Deleted

## 2021-07-28 DIAGNOSIS — E7849 Other hyperlipidemia: Secondary | ICD-10-CM

## 2021-07-28 DIAGNOSIS — I110 Hypertensive heart disease with heart failure: Secondary | ICD-10-CM

## 2021-07-28 MED ORDER — HYDROCHLOROTHIAZIDE 25 MG PO TABS: 25.0000 mg | ORAL_TABLET | Freq: Every day | ORAL | 0 refills | Status: DC

## 2021-07-28 MED ORDER — PRAVASTATIN SODIUM 10 MG PO TABS: 10.0000 mg | ORAL_TABLET | Freq: Every day | ORAL | 0 refills | Status: DC

## 2021-07-28 MED ORDER — LOSARTAN POTASSIUM 100 MG PO TABS: 100.0000 mg | ORAL_TABLET | Freq: Every day | ORAL | 0 refills | Status: DC

## 2021-07-28 NOTE — Telephone Encounter (Signed)
°*  STAT* If patient is at the pharmacy, call can be transferred to refill team.   1. Which medications need to be refilled? (please list name of each medication and dose if known)  pravastatin (PRAVACHOL) 10 MG tablet hydrochlorothiazide (HYDRODIURIL) 25 MG tablet losartan (COZAAR) 100 MG tablet  2. Which pharmacy/location (including street and city if local pharmacy) is medication to be sent to? [facility] # [place], [place]  3. Do they need a 30 day or 90 day supply?   Patient states she has a 15 day supply, but it will not last her until 4/11 appointment with Dr. Johney Frame. She is requesting enough medication to last her until her appointment.

## 2021-08-03 ENCOUNTER — Other Ambulatory Visit: Payer: Self-pay | Admitting: *Deleted

## 2021-08-03 MED ORDER — LOSARTAN POTASSIUM 100 MG PO TABS: 100.0000 mg | ORAL_TABLET | Freq: Every day | ORAL | 0 refills | Status: DC

## 2021-09-12 NOTE — Progress Notes (Deleted)
?Cardiology Office Note:   ? ?Date:  09/12/2021  ? ?ID:  EVER HALBERG, DOB [birth year], MRN [id number] ? ?PCP:  Leonard Downing, MD ?  ?[facility] Providers ?Cardiologist:  Ena Dawley, MD { ? ? ?Referring MD: Leonard Downing, *  ? ? ?History of Present Illness:   ? ?Allison Wells is a 81 y.o. female with a hx of breast cancer, HLD, and HTN who was previously followed by Dr. Meda Coffee who now presents to clinic for follow-up. ? ?Per review of the record, the patient has history of breast cancer s/p anastrazole and XRT therapy. Saw Dr. Meda Coffee for SOB where coronary CTA in 2018 showed a calcium score of 0 and no evidence of coronary artery disease. Because of findings of old granulomatous disease on chest CT Dr. Melvyn Novas ordered cardiopulmonary stress test that showed normal lung function, VO66mx of 19 that represents 121% of predicted maximum.  ? ?She last saw Dr. NMeda Coffeein 06/2020 where she was suffering from palpitations. Cardiac monitor showed 10 nonsustained episodes of SVT and occasional PVCs and PACs. ? ?Past Medical History:  ?Diagnosis Date  ? Allergy   ? [place]  ? Right breast  ? Bulging lumbar disc 2008  ? Carpal tunnel syndrome on right   ? Cataract   ? Gallstones 1970  ? GERD (gastroesophageal reflux disease)   ? Hyperlipidemia   ? Hypertension   ? Osteoporosis   ? Sebaceous cyst 2011  ? ? ?Past Surgical History:  ?Procedure Laterality Date  ? APPENDECTOMY  1970  ? BREAST LUMPECTOMY Right 09/27/2005  ? BREAST MASS EXCISION Right 10/10/2005  ? CARPAL TUNNEL RELEASE  2010  ? CHOLECYSTECTOMY  1970  ? COLONOSCOPY    ? 2008  ? TUBAL LIGATION  1970  ? ? ?Current Medications: ?No outpatient medications have been marked as taking for the 09/14/21 encounter (Appointment) with PFreada Bergeron MD.  ? ?Current Facility-Administered Medications for the 09/14/21 encounter (Appointment) with PFreada Bergeron MD  ?Medication  ? 0.9 %  sodium chloride infusion  ?  ? ?Allergies:    Rosuvastatin  ? ?Social History  ? ?Socioeconomic History  ? Marital status: Married  ?  Spouse name: Not on file  ? Number of children: Not on file  ? Years of education: Not on file  ? Highest education level: Not on file  ?Occupational History  ? Not on file  ?Tobacco Use  ? Smoking status: Never  ? Smokeless tobacco: Never  ?Vaping Use  ? Vaping Use: Never used  ?Substance and Sexual Activity  ? Alcohol use: Yes  ?  Alcohol/week: 7.0 standard drinks  ?  Types: 7 Glasses of wine per week  ? Drug use: No  ? Sexual activity: Not Currently  ?  Birth control/protection: Surgical, Post-menopausal  ?Other Topics Concern  ? Not on file  ?Social History Narrative  ? Not on file  ? ?Social Determinants of Health  ? ?Financial Resource Strain: Not on file  ?Food Insecurity: Not on file  ?Transportation Needs: Not on file  ?Physical Activity: Not on file  ?Stress: Not on file  ?Social Connections: Not on file  ?  ? ?Family History: ?The patient's ***family history includes Alzheimer's disease in her mother; Cancer in her cousin and paternal grandfather; Diabetes in her paternal aunt; Emphysema in her father; Heart Problems in her mother; Heart disease in her father and mother; Stomach cancer in her paternal grandfather; Stroke in her father. There  is no history of Colon cancer. ? ?ROS:   ?Please see the history of present illness.    ?*** All other systems reviewed and are negative. ? ?EKGs/Labs/Other Studies Reviewed:   ? ?The following studies were reviewed today: ?Cardiac Monitor 07/2020: ?Patient had a min HR of 50 bpm, max HR of 160 bpm, and avg HR of 74 bpm. ?First Degree AV Block was present. ?10 Supraventricular Tachycardia runs occurred, the run with the fastest interval lasting 8 beats. ?Occasional PACs and PVCs. ?  ?Predominant underlying rhythm was Sinus Rhythm. ?10 very short Supraventricular Tachycardia runs occurred. ?  ?Benign monitor, symptoms didn't correlate with any arrhythmias. No treatment is  needed. ?If the patient has significant symptoms a therapy with flecainide can be considered. ? ?Coronary CTA 11/2016: ?FINDINGS: ?A 120 kV prospective scan was triggered in the descending thoracic ?aorta at 111 HU's. Axial non-contrast 3 mm slices were carried out ?through the heart. The data set was analyzed on a dedicated work ?station and scored using the [place]. Gantry rotation speed ?was 270 msecs and collimation was .9 mm. 5 mg of iv metoprolol and ?0.8 mg of sl NTG was given. The 3D data set was reconstructed in 5% ?intervals of the 67-82 % of the R-R cycle. Diastolic phases were ?analyzed on a dedicated work station using MPR, MIP and VRT modes. ?The patient received 80 cc of contrast. ?  ?Aorta: Normal size. Mild calcifications and atherosclerotic plaque. ?No dissection. ?  ?Aortic Valve:  Trileaflet.  No calcifications. ?  ?Coronary Arteries:  Normal coronary origin.  Right dominance. ?  ?RCA is a large dominant artery that gives rise to an acute marginal ?branch, PDA and PLVB. There is minimal non-obstructive non-calcified ?plaque. ?  ?Left main is a large artery that gives rise to LAD and LCX arteries. ?  ?LAD is a medium size vessel that has minimal non-obstructive ?non-calcified plaque in the distal portion. ?  ?D1 and D2 are small and have no plaque. ?  ?LCX is a very small non-dominant artery that has no plaque. ?  ?Other findings: ?  ?Normal pulmonary vein drainage into the left atrium. ?  ?Normal let atrial appendage without a thrombus. ?  ?Normal pulmonary artery size. ?  ?IMPRESSION: ?1. Coronary calcium score of 0. This was 0 percentile for age and ?sex matched control. ?  ?2. Normal coronary origin with right dominance. ?  ?3. Minimal non-obstructive CAD. ? ?TTE: 07/21/2015 ?Study Conclusions ?  ?- Left ventricle: The cavity size was normal. Wall thickness was ?  normal. Systolic function was normal. The estimated ejection ?  fraction was in the range of 55% to 60%. Wall motion was  normal; ?  there were no regional wall motion abnormalities. Doppler ?  parameters are consistent with abnormal left ventricular ?  relaxation (grade 1 diastolic dysfunction). ?- Mitral valve: There was mild regurgitation. ?  ?Impressions: ?  ?- Normal LV systolic function; grade 1 diastolic dysfunction; mild ?  MR and TR. ?  ?Exercise nuclear stress test: 07/21/2015 ?The left ventricular ejection fraction is hyperdynamic (>65%). ?Nuclear stress EF: 75%. ?Blood pressure demonstrated a hypertensive response to exercise. ?The myocardial perfusion imaging is normal. No ischemia noted. ?This is a low risk study. ?Nondiagnostic EKG for ischemia. Downsloping ST segments noted in recovery lasting 12 minutes. ? ? ?EKG:  EKG is *** ordered today.  The ekg ordered today demonstrates *** ? ?Recent Labs: ?No results found for requested labs within last 8760 hours.  ?  Recent Lipid Panel ?   ?Component Value Date/Time  ? CHOL 176 03/15/2017 0844  ? TRIG 69 03/15/2017 0844  ? HDL 67 03/15/2017 0844  ? CHOLHDL 2.6 03/15/2017 0844  ? CHOLHDL 2.8 08/19/2015 0840  ? VLDL 21 08/19/2015 0840  ? [place] 95 03/15/2017 0844  ? ? ? ?Risk Assessment/Calculations:   ?{Does this patient have ATRIAL FIBRILLATION?[phone]} ? ?    ? ?Physical Exam:   ? ?VS:  There were no vitals taken for this visit.   ? ?Wt Readings from Last 3 Encounters:  ?06/29/20 152 lb (68.9 kg)  ?04/25/19 151 lb (68.5 kg)  ?04/23/18 155 lb (70.3 kg)  ?  ? ?GEN: *** Well nourished, well developed in no acute distress ?HEENT: Normal ?NECK: No JVD; No carotid bruits ?LYMPHATICS: No lymphadenopathy ?CARDIAC: ***RRR, no murmurs, rubs, gallops ?RESPIRATORY:  Clear to auscultation without rales, wheezing or rhonchi  ?ABDOMEN: Soft, non-tender, non-distended ?MUSCULOSKELETAL:  No edema; No deformity  ?SKIN: Warm and dry ?NEUROLOGIC:  Alert and oriented x 3 ?PSYCHIATRIC:  Normal affect  ? ?ASSESSMENT:   ? ?No diagnosis found. ?PLAN:   ? ?In order of problems listed  above: ? ?#Dyspnea on Exertion: ?Stable. TTE in 2017 with normal BiV function, G1DD, mikld MR. Coronary CTA in 2018 with no disease. Ca score 0. PFTs normal. ?-Increase regular exercise as tolerated ? ?#HTN: ?-Continue losartan 1

## 2021-09-14 ENCOUNTER — Ambulatory Visit: Payer: Medicare Other | Admitting: Cardiology

## 2021-09-14 ENCOUNTER — Encounter: Payer: Self-pay | Admitting: Cardiology

## 2021-09-14 VITALS — BP 158/80 | HR 65 | Ht 62.0 in | Wt 152.8 lb

## 2021-09-14 DIAGNOSIS — R002 Palpitations: Secondary | ICD-10-CM | POA: Diagnosis not present

## 2021-09-14 DIAGNOSIS — Z79899 Other long term (current) drug therapy: Secondary | ICD-10-CM | POA: Diagnosis not present

## 2021-09-14 DIAGNOSIS — R0609 Other forms of dyspnea: Secondary | ICD-10-CM

## 2021-09-14 DIAGNOSIS — I340 Nonrheumatic mitral (valve) insufficiency: Secondary | ICD-10-CM

## 2021-09-14 DIAGNOSIS — E782 Mixed hyperlipidemia: Secondary | ICD-10-CM

## 2021-09-14 DIAGNOSIS — I110 Hypertensive heart disease with heart failure: Secondary | ICD-10-CM

## 2021-09-14 MED ORDER — METOPROLOL TARTRATE 25 MG PO TABS: 12.5000 mg | ORAL_TABLET | Freq: Two times a day (BID) | ORAL | 3 refills | Status: DC | PRN

## 2021-09-14 MED ORDER — AMLODIPINE BESYLATE 5 MG PO TABS: 5.0000 mg | ORAL_TABLET | Freq: Every day | ORAL | 3 refills | Status: DC

## 2021-09-14 NOTE — Progress Notes (Signed)
?Cardiology Office Note:   ? ?Date:  09/14/2021  ? ?ID:  Allison Wells, DOB [birth year], MRN [id number] ? ?PCP:  Leonard Downing, MD ?  ?[facility] Providers ?Cardiologist:  Ena Dawley, MD { ? ? ?Referring MD: Leonard Downing, *  ? ? ?History of Present Illness:   ? ?Allison Wells is a 81 y.o. female with a hx of breast cancer, HLD, and HTN who was previously followed by Dr. Meda Coffee who now presents to clinic for follow-up. ? ?Per review of the record, the patient has history of breast cancer s/p anastrazole and XRT therapy. Saw Dr. Meda Coffee for SOB where coronary CTA in 2018 showed a calcium score of 0 and no evidence of coronary artery disease. Because of findings of old granulomatous disease on chest CT Dr. Melvyn Novas ordered cardiopulmonary stress test that showed normal lung function, VO4mx of 19 that represents 121% of predicted maximum.  ? ?She last saw Dr. NMeda Coffeein 06/2020 where she was suffering from palpitations. Cardiac monitor showed 10 nonsustained episodes of SVT and occasional PVCs and PACs. ? ?Today, the patient states she is doing well. She continues to have dyspnea which has been an ongoing, chronic issue. She is under a lot of stress due to her husband having cancer and has to support and take care of him. She says when she is walking around the house and doing other activities including while talking on the phone, she experiences shortness of breath. Of note, she states that she is a "shallow breather" at baseline. Prior work-up as detailed above has been unremarkable. ? ?She continues to have intermittent palpitations especially at night. No associated lightheadedness or dizziness. She doesn't check her blood pressure at home consistently, but reports when she has been to doctors office, her blood pressure is high. Today in the clinic her blood pressure is 158/80.  ? ?Now that restrictions are easing since COVID, she says that she is doing more things for herself and increasing her  activity.   ? ?She denies any chest pain, or peripheral edema. No lightheadedness, headaches, syncope, orthopnea, or PND.  ? ?She is also is experiencing some joint pain and relates it to arthritis. She has seen orthopedics, and received steroidal injections for her back and bilateral hands. Otherwise she also takes ibuprofen for pain management. ? ? ?Past Medical History:  ?Diagnosis Date  ? Allergy   ? [place]  ? Right breast  ? Bulging lumbar disc 2008  ? Carpal tunnel syndrome on right   ? Cataract   ? Gallstones 1970  ? GERD (gastroesophageal reflux disease)   ? Hyperlipidemia   ? Hypertension   ? Osteoporosis   ? Sebaceous cyst 2011  ? ? ?Past Surgical History:  ?Procedure Laterality Date  ? APPENDECTOMY  1970  ? BREAST LUMPECTOMY Right 09/27/2005  ? BREAST MASS EXCISION Right 10/10/2005  ? CARPAL TUNNEL RELEASE  2010  ? CHOLECYSTECTOMY  1970  ? COLONOSCOPY    ? 2008  ? TUBAL LIGATION  1970  ? ? ?Current Medications: ?Current Meds  ?Medication Sig  ? ALPRAZolam (XANAX) 0.5 MG tablet Take 0.5 mg by mouth 2 (two) times daily as needed.  ? amLODipine (NORVASC) 5 MG tablet Take 1 tablet (5 mg total) by mouth daily.  ? Cholecalciferol (VITAMIN D) 2000 units CAPS Take 1 capsule by mouth daily.  ? cyanocobalamin 2000 MCG tablet Take 2,500 mcg by mouth daily.  ? hydrochlorothiazide (HYDRODIURIL) 25 MG tablet Take 1  tablet (25 mg total) by mouth daily. Please keep upcoming appointment in April 2023 for future refills. Thank you  ? HYDROcodone-acetaminophen (NORCO/VICODIN) 5-325 MG tablet Take 0.5 tablets by mouth every 6 (six) hours as needed.  ? Loratadine 10 MG CAPS Take 1 capsule by mouth daily.  ? losartan (COZAAR) 100 MG tablet Take 1 tablet (100 mg total) by mouth at bedtime. Please keep upcoming appointment in April 2023 for future refills. Thank you  ? metoprolol tartrate (LOPRESSOR) 25 MG tablet Take 0.5 tablets (12.5 mg total) by mouth 2 (two) times daily as needed (palpitations).  ?  omeprazole (PRILOSEC) 20 MG capsule Take 20 mg by mouth daily.  ? polyethylene glycol powder (GLYCOLAX/MIRALAX) 17 GM/SCOOP powder Take 17 g by mouth daily.  ? pravastatin (PRAVACHOL) 10 MG tablet Take 1 tablet (10 mg total) by mouth daily. Please keep upcoming appointment in April 2023 for future refills.Thank you  ? ?Current Facility-Administered Medications for the 09/14/21 encounter (Office Visit) with Freada Bergeron, MD  ?Medication  ? 0.9 %  sodium chloride infusion  ?  ? ?Allergies:   Rosuvastatin  ? ?Social History  ? ?Socioeconomic History  ? Marital status: Married  ?  Spouse name: Not on file  ? Number of children: Not on file  ? Years of education: Not on file  ? Highest education level: Not on file  ?Occupational History  ? Not on file  ?Tobacco Use  ? Smoking status: Never  ? Smokeless tobacco: Never  ?Vaping Use  ? Vaping Use: Never used  ?Substance and Sexual Activity  ? Alcohol use: Yes  ?  Alcohol/week: 7.0 standard drinks  ?  Types: 7 Glasses of wine per week  ? Drug use: No  ? Sexual activity: Not Currently  ?  Birth control/protection: Surgical, Post-menopausal  ?Other Topics Concern  ? Not on file  ?Social History Narrative  ? Not on file  ? ?Social Determinants of Health  ? ?Financial Resource Strain: Not on file  ?Food Insecurity: Not on file  ?Transportation Needs: Not on file  ?Physical Activity: Not on file  ?Stress: Not on file  ?Social Connections: Not on file  ?  ? ?Family History: ?The patient's family history includes Alzheimer's disease in her mother; Cancer in her cousin and paternal grandfather; Diabetes in her paternal aunt; Emphysema in her father; Heart Problems in her mother; Heart disease in her father and mother; Stomach cancer in her paternal grandfather; Stroke in her father. There is no history of Colon cancer. ? ?ROS:   ?Review of Systems  ?Constitutional:  Negative for malaise/fatigue and weight loss.  ?HENT:  Negative for congestion, hearing loss and sinus pain.    ?Eyes:  Negative for blurred vision.  ?Respiratory:  Positive for shortness of breath. Negative for cough, hemoptysis and wheezing.   ?Cardiovascular:  Positive for palpitations. Negative for chest pain, orthopnea, claudication, leg swelling and PND.  ?Gastrointestinal:  Negative for heartburn and nausea.  ?Genitourinary:  Negative for dysuria, frequency and urgency.  ?Musculoskeletal:  Positive for joint pain. Negative for falls and myalgias.  ?Skin:  Negative for itching and rash.  ?Neurological:  Negative for tingling, tremors, weakness and headaches.  ?Psychiatric/Behavioral:  Negative for memory loss.    ? ?EKGs/Labs/Other Studies Reviewed:   ? ?The following studies were reviewed today: ? ?Cardiac Monitor 07/2020: ?Patient had a min HR of 50 bpm, max HR of 160 bpm, and avg HR of 74 bpm. ?First Degree AV Block was present. ?10  Supraventricular Tachycardia runs occurred, the run with the fastest interval lasting 8 beats. ?Occasional PACs and PVCs. ?  ?Predominant underlying rhythm was Sinus Rhythm. ?10 very short Supraventricular Tachycardia runs occurred. ?  ?Benign monitor, symptoms didn't correlate with any arrhythmias. No treatment is needed. ?If the patient has significant symptoms a therapy with flecainide can be considered. ? ?Coronary CTA 11/2016: ?FINDINGS: ?A 120 kV prospective scan was triggered in the descending thoracic ?aorta at 111 HU's. Axial non-contrast 3 mm slices were carried out ?through the heart. The data set was analyzed on a dedicated work ?station and scored using the [place]. Gantry rotation speed ?was 270 msecs and collimation was .9 mm. 5 mg of iv metoprolol and ?0.8 mg of sl NTG was given. The 3D data set was reconstructed in 5% ?intervals of the 67-82 % of the R-R cycle. Diastolic phases were ?analyzed on a dedicated work station using MPR, MIP and VRT modes. ?The patient received 80 cc of contrast. ?  ?Aorta: Normal size. Mild calcifications and atherosclerotic plaque. ?No  dissection. ?  ?Aortic Valve:  Trileaflet.  No calcifications. ?  ?Coronary Arteries:  Normal coronary origin.  Right dominance. ?  ?RCA is a large dominant artery that gives rise to an acute marginal ?bran

## 2021-09-14 NOTE — Patient Instructions (Signed)
Medication Instructions:  ?Start Amlodipine 5 mg daily  ?Start Metoprolol 12.5 mg twice a day as needed for palpitations  ? ?*If you need a refill on your cardiac medications before your next appointment, please call your pharmacy* ? ? ?Lab Work: ?Your physician recommends that you return for a FASTING lipid profile and basic metabolic panel at your earliest convenience.  ? ?If you have labs (blood work) drawn today and your tests are completely normal, you will receive your results only by: ?MyChart Message (if you have MyChart) OR ?A paper copy in the mail ?If you have any lab test that is abnormal or we need to change your treatment, we will call you to review the results. ? ? ?Testing/Procedures: ?Your physician has requested that you have an echocardiogram. Echocardiography is a painless test that uses sound waves to create images of your heart. It provides your doctor with information about the size and shape of your heart and how well your heart?s chambers and valves are working. This procedure takes approximately one hour. There are no restrictions for this procedure. ? ? ? ?Follow-Up: ?At [facility], you and your health needs are our priority.  As part of our continuing mission to provide you with exceptional heart care, we have created designated Provider Care Teams.  These Care Teams include your primary Cardiologist (physician) and Advanced Practice Providers (APPs -  Physician Assistants and Nurse Practitioners) who all work together to provide you with the care you need, when you need it. ? ?We recommend signing up for the patient portal called "MyChart".  Sign up information is provided on this After Visit Summary.  MyChart is used to connect with patients for Virtual Visits (Telemedicine).  Patients are able to view lab/test results, encounter notes, upcoming appointments, etc.  Non-urgent messages can be sent to your provider as well.   ?To learn more about what you can do with MyChart, go to  [email or web address].   ? ?Your next appointment:   ?12 month(s) ? ?The format for your next appointment:   ?In Person ? ?Provider:   ?Gwyndolyn Kaufman, MD ? ? ?Other Instructions ? ? ?Important Information About Sugar ? ? ? ? ?  ?

## 2021-09-15 ENCOUNTER — Other Ambulatory Visit: Payer: Medicare Other | Admitting: *Deleted

## 2021-09-15 DIAGNOSIS — Z79899 Other long term (current) drug therapy: Secondary | ICD-10-CM

## 2021-09-15 LAB — BASIC METABOLIC PANEL
BUN/Creatinine Ratio: 21 (ref 12–28)
BUN: 17 mg/dL (ref 8–27)
CO2: 25 mmol/L (ref 20–29)
Calcium: 9.9 mg/dL (ref 8.7–10.3)
Chloride: 98 mmol/L (ref 96–106)
Creatinine, Ser: 0.8 mg/dL (ref 0.57–1.00)
Glucose: 106 mg/dL — ABNORMAL HIGH (ref 70–99)
Potassium: 4 mmol/L (ref 3.5–5.2)
Sodium: 139 mmol/L (ref 134–144)
eGFR: 74 mL/min/{1.73_m2} (ref >59)

## 2021-09-15 LAB — LIPID PANEL
Chol/HDL Ratio: 2.9 ratio (ref 0.0–4.4)
Cholesterol, Total: 226 mg/dL — ABNORMAL HIGH (ref 100–199)
HDL: 79 mg/dL (ref >39)
LDL Chol Calc (NIH): 131 mg/dL — ABNORMAL HIGH (ref 0–99)
Triglycerides: 95 mg/dL (ref 0–149)
VLDL Cholesterol Cal: 16 mg/dL (ref 5–40)

## 2021-09-16 ENCOUNTER — Other Ambulatory Visit: Payer: Self-pay | Admitting: *Deleted

## 2021-09-16 DIAGNOSIS — E785 Hyperlipidemia, unspecified: Secondary | ICD-10-CM

## 2021-09-16 MED ORDER — PRAVASTATIN SODIUM 20 MG PO TABS: 20.0000 mg | ORAL_TABLET | Freq: Every day | ORAL | 3 refills | Status: DC

## 2021-09-26 ENCOUNTER — Encounter: Payer: Self-pay | Admitting: Cardiology

## 2021-09-27 ENCOUNTER — Ambulatory Visit (HOSPITAL_COMMUNITY): Payer: Medicare Other | Attending: Internal Medicine

## 2021-09-27 DIAGNOSIS — I340 Nonrheumatic mitral (valve) insufficiency: Secondary | ICD-10-CM | POA: Diagnosis not present

## 2021-09-27 LAB — ECHOCARDIOGRAM COMPLETE
AV Area VTI: 1.73 cm2
AV Area mean vel: 1.76 cm2
AV Mean grad: 8 mmHg
Area-P 1/2: 3.08 cm2
S' Lateral: 2.3 cm

## 2021-10-28 ENCOUNTER — Other Ambulatory Visit: Payer: Self-pay

## 2021-10-28 DIAGNOSIS — I110 Hypertensive heart disease with heart failure: Secondary | ICD-10-CM

## 2021-10-28 DIAGNOSIS — E7849 Other hyperlipidemia: Secondary | ICD-10-CM

## 2021-10-28 MED ORDER — HYDROCHLOROTHIAZIDE 25 MG PO TABS: 25.0000 mg | ORAL_TABLET | Freq: Every day | ORAL | 3 refills | Status: DC

## 2021-11-11 ENCOUNTER — Other Ambulatory Visit: Payer: Self-pay

## 2021-11-11 MED ORDER — LOSARTAN POTASSIUM 100 MG PO TABS: 100.0000 mg | ORAL_TABLET | Freq: Every day | ORAL | 0 refills | Status: DC

## 2021-11-16 ENCOUNTER — Other Ambulatory Visit: Payer: Medicare Other

## 2021-11-16 DIAGNOSIS — E785 Hyperlipidemia, unspecified: Secondary | ICD-10-CM

## 2021-11-16 LAB — HEPATIC FUNCTION PANEL
ALT: 18 IU/L (ref 0–32)
AST: 22 IU/L (ref 0–40)
Albumin: 4.5 g/dL (ref 3.7–4.7)
Alkaline Phosphatase: 109 IU/L (ref 44–121)
Bilirubin Total: 0.5 mg/dL (ref 0.0–1.2)
Bilirubin, Direct: 0.16 mg/dL (ref 0.00–0.40)
Total Protein: 6.7 g/dL (ref 6.0–8.5)

## 2021-11-16 LAB — LIPID PANEL
Chol/HDL Ratio: 3.5 ratio (ref 0.0–4.4)
Cholesterol, Total: 234 mg/dL — ABNORMAL HIGH (ref 100–199)
HDL: 67 mg/dL (ref >39)
LDL Chol Calc (NIH): 143 mg/dL — ABNORMAL HIGH (ref 0–99)
Triglycerides: 139 mg/dL (ref 0–149)
VLDL Cholesterol Cal: 24 mg/dL (ref 5–40)

## 2021-11-17 ENCOUNTER — Telehealth: Payer: Self-pay | Admitting: *Deleted

## 2021-11-17 DIAGNOSIS — Z79899 Other long term (current) drug therapy: Secondary | ICD-10-CM

## 2021-11-17 DIAGNOSIS — E785 Hyperlipidemia, unspecified: Secondary | ICD-10-CM

## 2021-11-17 DIAGNOSIS — E782 Mixed hyperlipidemia: Secondary | ICD-10-CM

## 2021-11-17 MED ORDER — EZETIMIBE 10 MG PO TABS: 10.0000 mg | ORAL_TABLET | Freq: Every day | ORAL | 3 refills | Status: DC

## 2021-11-17 NOTE — Telephone Encounter (Signed)
The patient has been notified of the result and verbalized understanding.  All questions (if any) were answered.  Pt states she is willing to try non-statin regimen of zetia 10 mg po daily. She was inquiring about follow-up lipids, so I advised her we can recheck her lipids in 3 months.  Made her a lab appt to recheck lipids in 3 months on 02/17/22.  She is aware to come fasting to this lab appt.  Confirmed the pharmacy of choice with the pt.  Pt verbalized understanding and agrees with this plan.

## 2021-11-17 NOTE — Telephone Encounter (Signed)
-----   Message from Freada Bergeron, MD sent at 11/17/2021  9:08 AM EDT ----- Her LDL is high at 143. Would she like to trial a higher dose of pravastatin '40mg'$  daily or add a non-statin called zetia '10mg'$  daily? If not, we can continue to monitor going forward.

## 2021-12-23 ENCOUNTER — Other Ambulatory Visit (HOSPITAL_COMMUNITY): Payer: Self-pay

## 2022-02-11 ENCOUNTER — Other Ambulatory Visit: Payer: Self-pay

## 2022-02-11 MED ORDER — LOSARTAN POTASSIUM 100 MG PO TABS: 100.0000 mg | ORAL_TABLET | Freq: Every day | ORAL | 2 refills | Status: DC

## 2022-02-17 ENCOUNTER — Ambulatory Visit: Payer: Medicare Other | Attending: Cardiology

## 2022-02-17 DIAGNOSIS — Z79899 Other long term (current) drug therapy: Secondary | ICD-10-CM

## 2022-02-17 DIAGNOSIS — E785 Hyperlipidemia, unspecified: Secondary | ICD-10-CM

## 2022-02-17 DIAGNOSIS — E782 Mixed hyperlipidemia: Secondary | ICD-10-CM

## 2022-02-17 LAB — LIPID PANEL
Chol/HDL Ratio: 3.1 ratio (ref 0.0–4.4)
Cholesterol, Total: 180 mg/dL (ref 100–199)
HDL: 58 mg/dL (ref >39)
LDL Chol Calc (NIH): 99 mg/dL (ref 0–99)
Triglycerides: 132 mg/dL (ref 0–149)
VLDL Cholesterol Cal: 23 mg/dL (ref 5–40)

## 2022-08-23 ENCOUNTER — Encounter: Payer: Self-pay | Admitting: Oncology

## 2022-08-29 ENCOUNTER — Other Ambulatory Visit: Payer: Self-pay | Admitting: *Deleted

## 2022-08-29 MED ORDER — AMLODIPINE BESYLATE 5 MG PO TABS: 5.0000 mg | ORAL_TABLET | Freq: Every day | ORAL | 3 refills | Status: DC

## 2022-09-12 NOTE — Progress Notes (Signed)
Cardiology Office Note:    Date:  09/21/2022   ID:  Allison LovelessLinda S Wells, DOB [birth year], MRN [id number]  PCP:  Allison MaskElkins, Allison Oliver, MD   [facility] Providers Cardiologist:  Tobias AlexanderKatarina Nelson, MD {   Referring MD: Allison MaskElkins, Allison Wells, *    History of Present Illness:    Allison Wells is a 82 y.o. female with a hx of breast cancer, HLD, and HTN who was previously followed by Dr. Delton SeeNelson who now presents to clinic for follow-up.  Per review of the record, the patient has history of breast cancer s/p anastrazole and XRT therapy. Saw Dr. Delton SeeNelson for SOB where coronary CTA in 2018 showed a calcium score of 0 and no evidence of coronary artery disease. Because of findings of old granulomatous disease on chest CT Dr. Sherene SiresWert ordered cardiopulmonary stress test that showed normal lung function, VO812max of 19 that represents 121% of predicted maximum.   She last saw Dr. Delton SeeNelson in 06/2020 where she was suffering from palpitations. Cardiac monitor showed 10 nonsustained episodes of SVT and occasional PVCs and PACs.  Was last seen in clinic on 09/2021 where she was under a lot of stress caring for her husband who was diagnosed with cancer. BP was elevated at that time and patient was started on amlodipine.    Today, the patient is doing okay. Has been struggling from significant back pain and has been taking a muscle relaxant and oxycodone for pain relief. Has also been following with [facility] and just underwent dorsal ramus radiofrequency ablation. Otherwise, is stable from a CV standpoint. Continues to have mild fatigue and dyspnea on exertion, but this is improving. Palpitations are overall improved and she has not had to use her metoprolol. No orthopnea, PND, or LE edema.   Blood pressure running 126-130 at home.    Past Medical History:  Diagnosis Date   Allergy    Breast cancer 09/27/2005   Right breast   Bulging lumbar disc 2008   Carpal tunnel syndrome on right    Cataract     Gallstones 1970   GERD (gastroesophageal reflux disease)    Hyperlipidemia    Hypertension    Osteoporosis    Sebaceous cyst 2011    Past Surgical History:  Procedure Laterality Date   APPENDECTOMY  1970   BREAST LUMPECTOMY Right 09/27/2005   BREAST MASS EXCISION Right 10/10/2005   CARPAL TUNNEL RELEASE  2010   CHOLECYSTECTOMY  1970   COLONOSCOPY     2008   TUBAL LIGATION  1970    Current Medications: Current Meds  Medication Sig   amLODipine (NORVASC) 5 MG tablet Take 1 tablet (5 mg total) by mouth daily.   Cholecalciferol (VITAMIN D) 2000 units CAPS Take 1 capsule by mouth daily.   cyanocobalamin 2000 MCG tablet Take 2,500 mcg by mouth daily.   ezetimibe (ZETIA) 10 MG tablet Take 1 tablet (10 mg total) by mouth daily.   hydrochlorothiazide (HYDRODIURIL) 25 MG tablet Take 1 tablet (25 mg total) by mouth daily.   Loratadine 10 MG CAPS Take 1 capsule by mouth daily.   losartan (COZAAR) 100 MG tablet Take 1 tablet (100 mg total) by mouth at bedtime.   omeprazole (PRILOSEC) 20 MG capsule Take 20 mg by mouth daily.   polyethylene glycol powder (GLYCOLAX/MIRALAX) 17 GM/SCOOP powder Take 17 g by mouth daily.   pravastatin (PRAVACHOL) 20 MG tablet Take 1 tablet (20 mg total) by mouth daily.   propranolol (INDERAL) 10 MG tablet Take  1 tablet (10 mg total) by mouth 2 (two) times daily as needed (palpitations/anxiety).   [DISCONTINUED] metoprolol tartrate (LOPRESSOR) 25 MG tablet Take 0.5 tablets (12.5 mg total) by mouth 2 (two) times daily as needed (palpitations).   Current Facility-Administered Medications for the 09/21/22 encounter (Office Visit) with Meriam Sprague, MD  Medication   0.9 %  sodium chloride infusion     Allergies:   Rosuvastatin   Social History   Socioeconomic History   Marital status: Married    Spouse name: Not on file   Number of children: Not on file   Years of education: Not on file   Highest education level: Not on file  Occupational History    Not on file  Tobacco Use   Smoking status: Never   Smokeless tobacco: Never  Vaping Use   Vaping Use: Never used  Substance and Sexual Activity   Alcohol use: Yes    Alcohol/week: 7.0 standard drinks of alcohol    Types: 7 Glasses of wine per week   Drug use: No   Sexual activity: Not Currently    Birth control/protection: Surgical, Post-menopausal  Other Topics Concern   Not on file  Social History Narrative   Not on file   Social Determinants of Health   Financial Resource Strain: Not on file  Food Insecurity: Not on file  Transportation Needs: Not on file  Physical Activity: Not on file  Stress: Not on file  Social Connections: Not on file     Family History: The patient's family history includes Alzheimer's disease in her mother; Cancer in her cousin and paternal grandfather; Diabetes in her paternal aunt; Emphysema in her father; Heart Problems in her mother; Heart disease in her father and mother; Stomach cancer in her paternal grandfather; Stroke in her father. There is no history of Colon cancer.  ROS:   Review of Systems  Constitutional:  Negative for malaise/fatigue and weight loss.  HENT:  Negative for congestion, hearing loss and sinus pain.   Eyes:  Negative for blurred vision.  Respiratory:  Positive for shortness of breath. Negative for cough, hemoptysis and wheezing.   Cardiovascular:  Positive for palpitations. Negative for chest pain, orthopnea, claudication, leg swelling and PND.  Gastrointestinal:  Negative for heartburn and nausea.  Genitourinary:  Negative for dysuria, frequency and urgency.  Musculoskeletal:  Positive for back pain, joint pain and myalgias.  Skin:  Negative for itching and rash.  Neurological:  Negative for tingling, tremors, weakness and headaches.  Psychiatric/Behavioral:  The patient is nervous/anxious.      EKGs/Labs/Other Studies Reviewed:    The following studies were reviewed today:  Cardiac Monitor 07/2020: Patient had  a min HR of 50 bpm, max HR of 160 bpm, and avg HR of 74 bpm. First Degree AV Block was present. 10 Supraventricular Tachycardia runs occurred, the run with the fastest interval lasting 8 beats. Occasional PACs and PVCs.   Predominant underlying rhythm was Sinus Rhythm. 10 very short Supraventricular Tachycardia runs occurred.   Benign monitor, symptoms didn't correlate with any arrhythmias. No treatment is needed. If the patient has significant symptoms a therapy with flecainide can be considered.  Coronary CTA 11/2016: FINDINGS: A 120 kV prospective scan was triggered in the descending thoracic aorta at 111 HU's. Axial non-contrast 3 mm slices were carried out through the heart. The data set was analyzed on a dedicated work station and scored using the Agatson method. Gantry rotation speed was 270 msecs and collimation was .9  mm. 5 mg of iv metoprolol and 0.8 mg of sl NTG was given. The 3D data set was reconstructed in 5% intervals of the 67-82 % of the R-R cycle. Diastolic phases were analyzed on a dedicated work station using MPR, MIP and VRT modes. The patient received 80 cc of contrast.   Aorta: Normal size. Mild calcifications and atherosclerotic plaque. No dissection.   Aortic Valve:  Trileaflet.  No calcifications.   Coronary Arteries:  Normal coronary origin.  Right dominance.   RCA is a large dominant artery that gives rise to an acute marginal branch, PDA and PLVB. There is minimal non-obstructive non-calcified plaque.   Left main is a large artery that gives rise to LAD and LCX arteries.   LAD is a medium size vessel that has minimal non-obstructive non-calcified plaque in the distal portion.   D1 and D2 are small and have no plaque.   LCX is a very small non-dominant artery that has no plaque.   Other findings:   Normal pulmonary vein drainage into the left atrium.   Normal let atrial appendage without a thrombus.   Normal pulmonary artery size.    IMPRESSION: 1. Coronary calcium score of 0. This was 0 percentile for age and sex matched control.   2. Normal coronary origin with right dominance.   3. Minimal non-obstructive CAD.  TTE: 07/21/2015 Study Conclusions   - Left ventricle: The cavity size was normal. Wall thickness was   normal. Systolic function was normal. The estimated ejection   fraction was in the range of 55% to 60%. Wall motion was normal;   there were no regional wall motion abnormalities. Doppler   parameters are consistent with abnormal left ventricular   relaxation (grade 1 diastolic dysfunction). - Mitral valve: There was mild regurgitation.   Impressions:   - Normal LV systolic function; grade 1 diastolic dysfunction; mild   MR and TR.   Exercise nuclear stress test: 07/21/2015 The left ventricular ejection fraction is hyperdynamic (>65%). Nuclear stress EF: 75%. Blood pressure demonstrated a hypertensive response to exercise. The myocardial perfusion imaging is normal. No ischemia noted. This is a low risk study. Nondiagnostic EKG for ischemia. Downsloping ST segments noted in recovery lasting 12 minutes.   EKG:  EKG is personally reviewed. NSR, iRBBB, HR 65  Recent Labs: 11/16/2021: ALT 18  Recent Lipid Panel    Component Value Date/Time   CHOL 180 02/17/2022 0739   TRIG 132 02/17/2022 0739   HDL 58 02/17/2022 0739   CHOLHDL 3.1 02/17/2022 0739   CHOLHDL 2.8 08/19/2015 0840   VLDL 21 08/19/2015 0840   LDLCALC 99 02/17/2022 0739     Risk Assessment/Calculations:           Physical Exam:    VS:  BP (!) 144/68   Pulse 65   Ht  (1.575 m)   Wt 149 lb 12.8 oz (67.9 kg)   SpO2 97%   BMI 27.40 kg/m     Wt Readings from Last 3 Encounters:  09/21/22 149 lb 12.8 oz (67.9 kg)  09/14/21 152 lb 12.8 oz (69.3 kg)  06/29/20 152 lb (68.9 kg)     GEN: Well nourished, well developed in no acute distress HEENT: Normal NECK: No JVD; No carotid bruits CARDIAC: RRR, 2/6 systolic  murmur. No rubs or gallops RESPIRATORY:  Clear to auscultation without rales, wheezing or rhonchi  ABDOMEN: Soft, non-tender, non-distended MUSCULOSKELETAL:  No edema; No deformity  SKIN: Warm and dry NEUROLOGIC:  Alert  and oriented x 3 PSYCHIATRIC:  Normal affect   ASSESSMENT:    1. Dyspnea on exertion   2. Mitral valve insufficiency, unspecified etiology   3. Palpitations   4. Mixed hyperlipidemia   5. Medication management   6. Arthritis     PLAN:    In order of problems listed above:  #Dyspnea on Exertion: Continues to have dyspnea on exertion with prior cardiac and pulmonary work-up reassuring. TTE in 2017 with normal BiV function, G1DD, mild MR. Coronary CTA in 2018 with no disease. Ca score 0. PFTs normal. Will plan for continued blood pressure control. -Increase regular exercise as tolerated -Blood pressure control as below  #HTN: -Blood pressure is better controlled at 120-130 at home -Continue losartan 100mg  daily -Conitnue HCTZ 25mg  daily -Continue amlodipine 5mg  daily  #Palpitations: Monitor with brief runs of SVT and occasional PACs and PVCs. Will change metop to propranolol to see if this helps with some anxiety. -Change metop to propranolol 10mg  BID prn as needed for palpitations  #Mild MR: Stable on TTE 09/2021.  -Continue to monitor  #HLD: -Continue prava 20mg  daily -LDL 99 (goal LDL<100) -Continue lifestyle modifications  Exercise recommendations: Goal of exercising for at least 30 minutes a day, at least 5 times per week.  Please exercise to a moderate exertion.  This means that while exercising it is difficult to speak in full sentences, however you are not so short of breath that you feel you must stop, and not so comfortable that you can carry on a full conversation.  Exertion level should be approximately a 5/10, if 10 is the most exertion you can perform.  Diet recommendations: Recommend a heart healthy diet such as the Mediterranean diet.   This diet consists of plant based foods, healthy fats, lean meats, olive oil.  It suggests limiting the intake of simple carbohydrates such as white breads, pastries, and pastas.  It also limits the amount of red meat, wine, and dairy products such as cheese that one should consume on a daily basis.           Follow-up in 1 year  Medication Adjustments/Labs and Tests Ordered: Current medicines are reviewed at length with the patient today.  Concerns regarding medicines are outlined above.  Orders Placed This Encounter  Procedures   Comp Met (CMET)   CBC w/Diff   HgB A1c   Lipid Profile   TSH   Sedimentation rate   EKG 12-Lead   Meds ordered this encounter  Medications   propranolol (INDERAL) 10 MG tablet    Sig: Take 1 tablet (10 mg total) by mouth 2 (two) times daily as needed (palpitations/anxiety).    Dispense:  60 tablet    Refill:  3    Patient Instructions  Medication Instructions:   STOP TAKING METOPROLOL NOW  START TAKING PROPRANOLOL 10 MG BY MOUTH TWICE DAILY AS NEEDED FOR PALPITATIONS/ANXIETY  *If you need a refill on your cardiac medications before your next appointment, please call your pharmacy*   Lab Work:  THIS FRIDAY 4/19 IN THE OFFICE--CMET, CBC, TSH, A1C, LIPIDS, ESR LEVEL--PLEASE COME FASTING TO THIS LAB APPOINTMENT  If you have labs (blood work) drawn today and your tests are completely normal, you will receive your results only by: MyChart Message (if you have MyChart) OR A paper copy in the mail If you have any lab test that is abnormal or we need to change your treatment, we will call you to review the results.    Follow-Up:  At [facility], you and your health needs are our priority.  As part of our continuing mission to provide you with exceptional heart care, we have created designated Provider Care Teams.  These Care Teams include your primary Cardiologist (physician) and Advanced Practice Providers (APPs -  Physician Assistants  and Nurse Practitioners) who all work together to provide you with the care you need, when you need it.  We recommend signing up for the patient portal called "MyChart".  Sign up information is provided on this After Visit Summary.  MyChart is used to connect with patients for Virtual Visits (Telemedicine).  Patients are able to view lab/test results, encounter notes, upcoming appointments, etc.  Non-urgent messages can be sent to your provider as well.   To learn more about what you can do with MyChart, go to [email or web address].    Your next appointment:   1 year(s)  Provider:   DR. Shari Prows     Signed, Meriam Sprague, MD  09/21/2022 10:08 AM    [facility]

## 2022-09-21 ENCOUNTER — Ambulatory Visit: Payer: Medicare Other | Attending: Cardiology | Admitting: Cardiology

## 2022-09-21 ENCOUNTER — Encounter: Payer: Self-pay | Admitting: Cardiology

## 2022-09-21 VITALS — BP 144/68 | HR 65 | Ht 62.0 in | Wt 149.8 lb

## 2022-09-21 DIAGNOSIS — R0609 Other forms of dyspnea: Secondary | ICD-10-CM

## 2022-09-21 DIAGNOSIS — Z79899 Other long term (current) drug therapy: Secondary | ICD-10-CM

## 2022-09-21 DIAGNOSIS — R002 Palpitations: Secondary | ICD-10-CM

## 2022-09-21 DIAGNOSIS — M1990 Unspecified osteoarthritis, unspecified site: Secondary | ICD-10-CM

## 2022-09-21 DIAGNOSIS — I340 Nonrheumatic mitral (valve) insufficiency: Secondary | ICD-10-CM

## 2022-09-21 DIAGNOSIS — E782 Mixed hyperlipidemia: Secondary | ICD-10-CM

## 2022-09-21 MED ORDER — PROPRANOLOL HCL 10 MG PO TABS
10.0000 mg | ORAL_TABLET | Freq: Two times a day (BID) | ORAL | 3 refills | Status: DC | PRN
Start: 2022-09-21 — End: 2023-06-30

## 2022-09-21 NOTE — Patient Instructions (Signed)
Medication Instructions:   STOP TAKING METOPROLOL NOW  START TAKING PROPRANOLOL 10 MG BY MOUTH TWICE DAILY AS NEEDED FOR PALPITATIONS/ANXIETY  *If you need a refill on your cardiac medications before your next appointment, please call your pharmacy*   Lab Work:  THIS FRIDAY 4/19 IN THE OFFICE--CMET, CBC, TSH, A1C, LIPIDS, ESR LEVEL--PLEASE COME FASTING TO THIS LAB APPOINTMENT  If you have labs (blood work) drawn today and your tests are completely normal, you will receive your results only by: MyChart Message (if you have MyChart) OR A paper copy in the mail If you have any lab test that is abnormal or we need to change your treatment, we will call you to review the results.    Follow-Up: At [facility], you and your health needs are our priority.  As part of our continuing mission to provide you with exceptional heart care, we have created designated Provider Care Teams.  These Care Teams include your primary Cardiologist (physician) and Advanced Practice Providers (APPs -  Physician Assistants and Nurse Practitioners) who all work together to provide you with the care you need, when you need it.  We recommend signing up for the patient portal called "MyChart".  Sign up information is provided on this After Visit Summary.  MyChart is used to connect with patients for Virtual Visits (Telemedicine).  Patients are able to view lab/test results, encounter notes, upcoming appointments, etc.  Non-urgent messages can be sent to your provider as well.   To learn more about what you can do with MyChart, go to [email or web address].    Your next appointment:   1 year(s)  Provider:   DR. Shari Prows

## 2022-09-23 ENCOUNTER — Ambulatory Visit: Payer: Medicare Other | Attending: Cardiology

## 2022-09-23 DIAGNOSIS — E782 Mixed hyperlipidemia: Secondary | ICD-10-CM

## 2022-09-23 DIAGNOSIS — I340 Nonrheumatic mitral (valve) insufficiency: Secondary | ICD-10-CM

## 2022-09-23 DIAGNOSIS — Z79899 Other long term (current) drug therapy: Secondary | ICD-10-CM

## 2022-09-23 DIAGNOSIS — R002 Palpitations: Secondary | ICD-10-CM

## 2022-09-23 DIAGNOSIS — M1990 Unspecified osteoarthritis, unspecified site: Secondary | ICD-10-CM

## 2022-09-23 DIAGNOSIS — R0609 Other forms of dyspnea: Secondary | ICD-10-CM

## 2022-09-24 LAB — COMPREHENSIVE METABOLIC PANEL
ALT: 16 IU/L (ref 0–32)
AST: 18 IU/L (ref 0–40)
Albumin/Globulin Ratio: 2 (ref 1.2–2.2)
Albumin: 4.3 g/dL (ref 3.7–4.7)
Alkaline Phosphatase: 101 IU/L (ref 44–121)
BUN/Creatinine Ratio: 18 (ref 12–28)
BUN: 13 mg/dL (ref 8–27)
Bilirubin Total: 0.6 mg/dL (ref 0.0–1.2)
CO2: 24 mmol/L (ref 20–29)
Calcium: 9.5 mg/dL (ref 8.7–10.3)
Chloride: 97 mmol/L (ref 96–106)
Creatinine, Ser: 0.71 mg/dL (ref 0.57–1.00)
Globulin, Total: 2.2 g/dL (ref 1.5–4.5)
Glucose: 97 mg/dL (ref 70–99)
Potassium: 3.9 mmol/L (ref 3.5–5.2)
Sodium: 136 mmol/L (ref 134–144)
Total Protein: 6.5 g/dL (ref 6.0–8.5)
eGFR: 85 mL/min/{1.73_m2} (ref >59)

## 2022-09-24 LAB — CBC WITH DIFFERENTIAL/PLATELET
Basophils Absolute: 0 10*3/uL (ref 0.0–0.2)
Basos: 1 %
EOS (ABSOLUTE): 0.1 10*3/uL (ref 0.0–0.4)
Eos: 3 %
Hematocrit: 42.1 % (ref 34.0–46.6)
Hemoglobin: 14 g/dL (ref 11.1–15.9)
Immature Grans (Abs): 0 10*3/uL (ref 0.0–0.1)
Immature Granulocytes: 0 %
Lymphocytes Absolute: 1.5 10*3/uL (ref 0.7–3.1)
Lymphs: 29 %
MCH: 28.7 pg (ref 26.6–33.0)
MCHC: 33.3 g/dL (ref 31.5–35.7)
MCV: 86 fL (ref 79–97)
Monocytes Absolute: 0.4 10*3/uL (ref 0.1–0.9)
Monocytes: 8 %
Neutrophils Absolute: 3.1 10*3/uL (ref 1.4–7.0)
Neutrophils: 59 %
Platelets: 352 10*3/uL (ref 150–450)
RBC: 4.88 x10E6/uL (ref 3.77–5.28)
RDW: 12.6 % (ref 11.7–15.4)
WBC: 5.3 10*3/uL (ref 3.4–10.8)

## 2022-09-24 LAB — SEDIMENTATION RATE: Sed Rate: 4 mm/h (ref 0–40)

## 2022-09-24 LAB — LIPID PANEL
Chol/HDL Ratio: 2.8 ratio (ref 0.0–4.4)
Cholesterol, Total: 208 mg/dL — ABNORMAL HIGH (ref 100–199)
HDL: 74 mg/dL (ref >39)
LDL Chol Calc (NIH): 108 mg/dL — ABNORMAL HIGH (ref 0–99)
Triglycerides: 151 mg/dL — ABNORMAL HIGH (ref 0–149)
VLDL Cholesterol Cal: 26 mg/dL (ref 5–40)

## 2022-09-24 LAB — HEMOGLOBIN A1C
Est. average glucose Bld gHb Est-mCnc: 123 mg/dL
Hgb A1c MFr Bld: 5.9 % — ABNORMAL HIGH (ref 4.8–5.6)

## 2022-09-24 LAB — TSH: TSH: 4.49 u[IU]/mL (ref 0.450–4.500)

## 2022-09-28 ENCOUNTER — Telehealth: Payer: Self-pay

## 2022-09-28 NOTE — Telephone Encounter (Signed)
Allison Sprague, MD 09/25/2022  3:06 PM EDT     A1C is borderline at 5.9. Cholesterol is stable. Kidney function, liver function, electrolytes, blood counts, TSH look great. ESR is normal.    The patient has been notified of the result and verbalized understanding.  All questions (if any) were answered. [name], RN 09/28/2022 9:08 AM

## 2022-10-06 ENCOUNTER — Other Ambulatory Visit: Payer: Self-pay

## 2022-10-06 DIAGNOSIS — E7849 Other hyperlipidemia: Secondary | ICD-10-CM

## 2022-10-06 DIAGNOSIS — I110 Hypertensive heart disease with heart failure: Secondary | ICD-10-CM

## 2022-10-06 MED ORDER — PRAVASTATIN SODIUM 20 MG PO TABS: 20.0000 mg | ORAL_TABLET | Freq: Every day | ORAL | 3 refills | Status: DC

## 2022-10-06 MED ORDER — HYDROCHLOROTHIAZIDE 25 MG PO TABS
25.0000 mg | ORAL_TABLET | Freq: Every day | ORAL | 3 refills | Status: DC
Start: 2022-10-06 — End: 2023-07-31

## 2022-10-06 NOTE — Telephone Encounter (Signed)
Pt's medication was sent to pt's pharmacy as requested. Confirmation received.  °

## 2022-10-21 ENCOUNTER — Ambulatory Visit
Admission: RE | Admit: 2022-10-21 | Discharge: 2022-10-21 | Disposition: A | Discharge status: Home / Self Care | Payer: Medicare Other | Source: Ambulatory Visit | Attending: Family Medicine | Admitting: Family Medicine

## 2022-10-21 ENCOUNTER — Other Ambulatory Visit: Payer: Self-pay | Admitting: Family Medicine

## 2022-10-21 DIAGNOSIS — M79642 Pain in left hand: Secondary | ICD-10-CM

## 2022-10-25 ENCOUNTER — Other Ambulatory Visit: Payer: Self-pay

## 2022-10-25 DIAGNOSIS — E785 Hyperlipidemia, unspecified: Secondary | ICD-10-CM

## 2022-10-25 DIAGNOSIS — E782 Mixed hyperlipidemia: Secondary | ICD-10-CM

## 2022-10-25 DIAGNOSIS — Z79899 Other long term (current) drug therapy: Secondary | ICD-10-CM

## 2022-10-25 MED ORDER — EZETIMIBE 10 MG PO TABS
10.0000 mg | ORAL_TABLET | Freq: Every day | ORAL | 3 refills | Status: DC
Start: 2022-10-25 — End: 2023-10-05

## 2022-11-09 ENCOUNTER — Other Ambulatory Visit: Payer: Self-pay

## 2022-11-09 MED ORDER — LOSARTAN POTASSIUM 100 MG PO TABS: 100.0000 mg | ORAL_TABLET | Freq: Every day | ORAL | 3 refills | Status: DC

## 2023-06-28 ENCOUNTER — Ambulatory Visit: Payer: Medicare Other | Admitting: Cardiology

## 2023-06-29 NOTE — Progress Notes (Signed)
Cardiology Office Note:    Date:  06/30/2023  ID:  [name], DOB 05-15-1941, MRN [id number] PCP: Kaleen Mask, MD  [facility] Providers Cardiologist:  Tobias Alexander, MD       Patient Profile:      Shortness of breath Myoview 07/21/2015: EF 75, no ischemia, low risk CCTA 11/21/2016: CAC score 0, minimal nonobstructive CAD CPET 01/20/2017: Normal functional capacity PFTs 2018: Normal TTE 09/27/2021: EF 70-75, no RWMA, mild LVH, normal RVSF, mild MR, mild AI, AV sclerosis Palpitations Monitor 07/2020: First-degree block, 10 SVT runs, occasional PACs and PVCs Mild mitral regurgitation Hypertension Hyperlipidemia Breast CA GERD         History of Present Illness:  Discussed the use of AI scribe software for clinical note transcription with the patient, who gave verbal consent to proceed.  Allison Wells is a 83 y.o. female who returns for evaluation of shortness of breath.  She was last seen by Dr. Shari Prows in April 2024.  She has an extensive workup for shortness of breath in the past and has seen pulmonology.  Coronary CTA in 2018 demonstrated no CAD.  Cardiopulmonary stress test demonstrated normal functional capacity in 2018.  PFTs were normal.  Echo in 2023 demonstrated normal LV function mild MR and mild AI.She is here alone. She presents with worsening shortness of breath over several months. The dyspnea is exacerbated by exertion, such as walking from the parking lot or climbing stairs and is associated with a sensation of hyperventilation. The patient also reports new-onset chest tightness and throat tightness, which are also exercise-induced. In addition to these symptoms, the patient has been experiencing palpitations, particularly during physical activity. These palpitations are sometimes accompanied by a sensation of the heart pounding, even when lying down, but do not wake the patient from sleep. The patient also notes mild ankle swelling, which improves  with leg elevation, and is more pronounced after being on her feet for extended periods. She also reports a general lack of energy, a 'foggy head,' and a decline in overall well-being. The patient denies any recent fever, chills, cough, hematuria, or melena. She has a non-smoking history. 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     Review of Systems  Constitutional: Negative for fever.  Respiratory:  Negative for cough.   Gastrointestinal:  Negative for hematochezia and melena.  Genitourinary:  Negative for hematuria.  -See HPI    Studies Reviewed:   EKG Interpretation Date/Time:  Friday June 30 2023 11:26:33 EST Ventricular Rate:  61 PR Interval:  174 QRS Duration:  90 QT Interval:  402 QTC Calculation: 404 R Axis:   23  Text Interpretation: Normal sinus rhythm Incomplete right bundle branch block ST & T wave abnormality, consider inferolateral ischemia Confirmed by Tereso Newcomer [phone]) on 06/30/2023 11:54:28 AM          Risk Assessment/Calculations:             Physical Exam:   VS:  BP 136/68   Pulse 61   Ht 5\' 2"  (1.575 m)   Wt 150 lb (68 kg)   SpO2 96%   BMI 27.44 kg/m    Wt Readings from Last 3 Encounters:  06/30/23 150 lb (68 kg)  09/21/22 149 lb 12.8 oz (67.9 kg)  09/14/21 152 lb 12.8 oz (69.3 kg)    Constitutional:      Appearance: Healthy appearance. Not in distress.  Neck:     Vascular: No carotid bruit. JVD normal.  Pulmonary:     Breath sounds: Normal breath sounds. No wheezing. No rales.  Cardiovascular:     Normal rate. Regular rhythm.     Murmurs: There is no murmur.  Edema:    Peripheral edema absent.  Abdominal:     Palpations: Abdomen is soft.      Assessment and Plan:   Assessment & Plan Exertional angina [facility]) She has had a long history of shortness of breath that has been worked up extensively in the past.  Coronary CTA in 2018 demonstrated calcium score of 0  and minimal nonobstructive CAD.  Over the past couple of months, she has developed worsening shortness of breath as well as exertional chest tightness with radiation up into her throat.  Resting improves the symptoms.  She has never had the chest tightness or throat tightness in the past.  Her electrocardiogram today does demonstrate subtle inferolateral T wave inversions.  This is changed compared to previous EKGs.  Overall, I think she is describing CCS class II-III angina.  I have recommended cardiac catheterization to further evaluate.  Through shared decision making, she agrees.  I reviewed this with Dr. Jimmey Ralph (attending MD) who also agrees. -Change propranolol to 10 mg twice daily -Continue Amlodipine 5 mg once daily, Pravastatin 20 mg once daily -Start ASA 81 mg once daily, NTG prn -Arrange R and L cardiac catheterization  -2D echocardiogram  -Follow up 2 weeks Shortness of breath As noted, she has had a long hx of this that has been worked up previously. Her shortness of breath has worsened. No signs of volume excess.  -R and L cardiac catheterization as noted -Echocardiogram  Palpitations She has occasional rapid palpitations. Will proceed with cardiac catheterization and echocardiogram. If she does not need a PCI and continues to have palpitations, will obtain Zio Monitor. Nonrheumatic mitral valve regurgitation Mild MR on echocardiogram in 2023. Given current symptoms, will repeat echocardiogram.      Informed Consent   Shared Decision Making/Informed Consent The risks [stroke (1 in 1000), death (1 in 1000), kidney failure [usually temporary] (1 in 500), bleeding (1 in 200), allergic reaction [possibly serious] (1 in 200)], benefits (diagnostic support and management of coronary artery disease) and alternatives of a cardiac catheterization were discussed in detail with Ms. Buchinger and she is willing to proceed.     Dispo:  Return for Post Procedure Follow Up.  Signed, Tereso Newcomer,  PA-C

## 2023-06-29 NOTE — H&P (View-Only) (Signed)
Cardiology Office Note:    Date:  06/30/2023  ID:  [name], DOB 05-15-1941, MRN [id number] PCP: Kaleen Mask, MD  [facility] Providers Cardiologist:  Tobias Alexander, MD       Patient Profile:      Shortness of breath Myoview 07/21/2015: EF 75, no ischemia, low risk CCTA 11/21/2016: CAC score 0, minimal nonobstructive CAD CPET 01/20/2017: Normal functional capacity PFTs 2018: Normal TTE 09/27/2021: EF 70-75, no RWMA, mild LVH, normal RVSF, mild MR, mild AI, AV sclerosis Palpitations Monitor 07/2020: First-degree block, 10 SVT runs, occasional PACs and PVCs Mild mitral regurgitation Hypertension Hyperlipidemia Breast CA GERD         History of Present Illness:  Discussed the use of AI scribe software for clinical note transcription with the patient, who gave verbal consent to proceed.  Allison Wells is a 83 y.o. female who returns for evaluation of shortness of breath.  She was last seen by Dr. Shari Prows in April 2024.  She has an extensive workup for shortness of breath in the past and has seen pulmonology.  Coronary CTA in 2018 demonstrated no CAD.  Cardiopulmonary stress test demonstrated normal functional capacity in 2018.  PFTs were normal.  Echo in 2023 demonstrated normal LV function mild MR and mild AI.She is here alone. She presents with worsening shortness of breath over several months. The dyspnea is exacerbated by exertion, such as walking from the parking lot or climbing stairs and is associated with a sensation of hyperventilation. The patient also reports new-onset chest tightness and throat tightness, which are also exercise-induced. In addition to these symptoms, the patient has been experiencing palpitations, particularly during physical activity. These palpitations are sometimes accompanied by a sensation of the heart pounding, even when lying down, but do not wake the patient from sleep. The patient also notes mild ankle swelling, which improves  with leg elevation, and is more pronounced after being on her feet for extended periods. She also reports a general lack of energy, a 'foggy head,' and a decline in overall well-being. The patient denies any recent fever, chills, cough, hematuria, or melena. She has a non-smoking history. 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     Review of Systems  Constitutional: Negative for fever.  Respiratory:  Negative for cough.   Gastrointestinal:  Negative for hematochezia and melena.  Genitourinary:  Negative for hematuria.  -See HPI    Studies Reviewed:   EKG Interpretation Date/Time:  Friday June 30 2023 11:26:33 EST Ventricular Rate:  61 PR Interval:  174 QRS Duration:  90 QT Interval:  402 QTC Calculation: 404 R Axis:   23  Text Interpretation: Normal sinus rhythm Incomplete right bundle branch block ST & T wave abnormality, consider inferolateral ischemia Confirmed by Tereso Newcomer [phone]) on 06/30/2023 11:54:28 AM          Risk Assessment/Calculations:             Physical Exam:   VS:  BP 136/68   Pulse 61   Ht 5\' 2"  (1.575 m)   Wt 150 lb (68 kg)   SpO2 96%   BMI 27.44 kg/m    Wt Readings from Last 3 Encounters:  06/30/23 150 lb (68 kg)  09/21/22 149 lb 12.8 oz (67.9 kg)  09/14/21 152 lb 12.8 oz (69.3 kg)    Constitutional:      Appearance: Healthy appearance. Not in distress.  Neck:     Vascular: No carotid bruit. JVD normal.  Pulmonary:     Breath sounds: Normal breath sounds. No wheezing. No rales.  Cardiovascular:     Normal rate. Regular rhythm.     Murmurs: There is no murmur.  Edema:    Peripheral edema absent.  Abdominal:     Palpations: Abdomen is soft.      Assessment and Plan:   Assessment & Plan Exertional angina [facility]) She has had a long history of shortness of breath that has been worked up extensively in the past.  Coronary CTA in 2018 demonstrated calcium score of 0  and minimal nonobstructive CAD.  Over the past couple of months, she has developed worsening shortness of breath as well as exertional chest tightness with radiation up into her throat.  Resting improves the symptoms.  She has never had the chest tightness or throat tightness in the past.  Her electrocardiogram today does demonstrate subtle inferolateral T wave inversions.  This is changed compared to previous EKGs.  Overall, I think she is describing CCS class II-III angina.  I have recommended cardiac catheterization to further evaluate.  Through shared decision making, she agrees.  I reviewed this with Dr. Jimmey Ralph (attending MD) who also agrees. -Change propranolol to 10 mg twice daily -Continue Amlodipine 5 mg once daily, Pravastatin 20 mg once daily -Start ASA 81 mg once daily, NTG prn -Arrange R and L cardiac catheterization  -2D echocardiogram  -Follow up 2 weeks Shortness of breath As noted, she has had a long hx of this that has been worked up previously. Her shortness of breath has worsened. No signs of volume excess.  -R and L cardiac catheterization as noted -Echocardiogram  Palpitations She has occasional rapid palpitations. Will proceed with cardiac catheterization and echocardiogram. If she does not need a PCI and continues to have palpitations, will obtain Zio Monitor. Nonrheumatic mitral valve regurgitation Mild MR on echocardiogram in 2023. Given current symptoms, will repeat echocardiogram.      Informed Consent   Shared Decision Making/Informed Consent The risks [stroke (1 in 1000), death (1 in 1000), kidney failure [usually temporary] (1 in 500), bleeding (1 in 200), allergic reaction [possibly serious] (1 in 200)], benefits (diagnostic support and management of coronary artery disease) and alternatives of a cardiac catheterization were discussed in detail with Ms. Buchinger and she is willing to proceed.     Dispo:  Return for Post Procedure Follow Up.  Signed, Tereso Newcomer,  PA-C

## 2023-06-30 ENCOUNTER — Ambulatory Visit: Payer: Medicare Other | Attending: Physician Assistant | Admitting: Physician Assistant

## 2023-06-30 ENCOUNTER — Encounter: Payer: Self-pay | Admitting: Physician Assistant

## 2023-06-30 VITALS — BP 136/68 | HR 61 | Ht 62.0 in | Wt 150.0 lb

## 2023-06-30 DIAGNOSIS — R0602 Shortness of breath: Secondary | ICD-10-CM

## 2023-06-30 DIAGNOSIS — R002 Palpitations: Secondary | ICD-10-CM

## 2023-06-30 DIAGNOSIS — I2089 Other forms of angina pectoris: Secondary | ICD-10-CM | POA: Diagnosis not present

## 2023-06-30 DIAGNOSIS — I340 Nonrheumatic mitral (valve) insufficiency: Secondary | ICD-10-CM

## 2023-06-30 MED ORDER — NITROGLYCERIN 0.4 MG SL SUBL: 0.4000 mg | SUBLINGUAL_TABLET | SUBLINGUAL | 3 refills | Status: AC | PRN

## 2023-06-30 MED ORDER — PROPRANOLOL HCL 10 MG PO TABS: 10.0000 mg | ORAL_TABLET | Freq: Two times a day (BID) | ORAL | 3 refills | Status: DC

## 2023-06-30 NOTE — Assessment & Plan Note (Signed)
As noted, she has had a long hx of this that has been worked up previously. Her shortness of breath has worsened. No signs of volume excess.  -R and L cardiac catheterization as noted -Echocardiogram

## 2023-06-30 NOTE — Patient Instructions (Addendum)
Medication Instructions:  Your physician has recommended you make the following change in your medication:  START Nitroglycerin 0.4 s/l tablet USE ONLY AS DIRECTED  START Aspirin 81 mg taking 1 daily  CHANGE Propranolol to twice a day everyday  *If you need a refill on your cardiac medications before your next appointment, please call your pharmacy*   Lab Work: TODAY:  BMET & CBC  If you have labs (blood work) drawn today and your tests are completely normal, you will receive your results only by: MyChart Message (if you have MyChart) OR A paper copy in the mail If you have any lab test that is abnormal or we need to change your treatment, we will call you to review the results.   Testing/Procedures: Your physician has requested that you have an echocardiogram. Echocardiography is a painless test that uses sound waves to create images of your heart. It provides your doctor with information about the size and shape of your heart and how well your heart's chambers and valves are working. This procedure takes approximately one hour. There are no restrictions for this procedure. Please do NOT wear cologne, perfume, aftershave, or lotions (deodorant is allowed). Please arrive 15 minutes prior to your appointment time.  Please note: We ask at that you not bring children with you during ultrasound (echo/ vascular) testing. Due to room size and safety concerns, children are not allowed in the ultrasound rooms during exams. Our front office staff cannot provide observation of children in our lobby area while testing is being conducted. An adult accompanying a patient to their appointment will only be allowed in the ultrasound room at the discretion of the ultrasound technician under special circumstances. We apologize for any inconvenience.     Your physician has requested that you have a cardiac catheterization. Cardiac catheterization is used to diagnose and/or treat various heart conditions.  Doctors may recommend this procedure for a number of different reasons. The most common reason is to evaluate chest pain. Chest pain can be a symptom of coronary artery disease (CAD), and cardiac catheterization can show whether plaque is narrowing or blocking your heart's arteries. This procedure is also used to evaluate the valves, as well as measure the blood flow and oxygen levels in different parts of your heart. For further information please visit [email or web address] Please follow instruction sheet, BELOW:        Cardiac/Peripheral Catheterization   You are scheduled for a Cardiac Catheterization on Monday, January 27 with Dr.  Jacinto Halim .  1. Please arrive at the [facility] (Main Entrance A) at [facility]: [address] [place], [place] [address] at 7:00 AM (This time is 2 hour(s) before your procedure to ensure your preparation).   Free valet parking service is available. You will check in at ADMITTING. The support person will be asked to wait in the waiting room.  It is OK to have someone drop you off and come back when you are ready to be discharged.        Special note: Every effort is made to have your procedure done on time. Please understand that emergencies sometimes delay scheduled procedures.  2. Diet: Do not eat solid foods after midnight.  You may have clear liquids until 5 AM the day of the procedure.  3. Labs: You will need to have blood drawn on TODAY  4. Medication instructions in preparation for your procedure:   Contrast Allergy: No     Current Outpatient Medications (Cardiovascular):  amLODipine (NORVASC) 5 MG tablet, Take 1 tablet (5 mg total) by mouth daily.   ezetimibe (ZETIA) 10 MG tablet, Take 1 tablet (10 mg total) by mouth daily.   hydrochlorothiazide (HYDRODIURIL) 25 MG tablet, Take 1 tablet (25 mg total) by mouth daily.   losartan (COZAAR) 100 MG tablet, Take 1 tablet (100 mg total) by mouth at bedtime.   pravastatin (PRAVACHOL) 20 MG  tablet, Take 1 tablet (20 mg total) by mouth daily.   propranolol (INDERAL) 10 MG tablet, Take 1 tablet (10 mg total) by mouth 2 (two) times daily as needed (palpitations/anxiety).   Current Outpatient Medications (Respiratory):    Loratadine 10 MG CAPS, Take 1 capsule by mouth daily.     Current Outpatient Medications (Hematological):    cyanocobalamin 2000 MCG tablet, Take 1,000 mcg by mouth daily.   Current Outpatient Medications (Other):    Cholecalciferol (VITAMIN D) 2000 units CAPS, Take 1,000 Units by mouth daily.   omeprazole (PRILOSEC) 20 MG capsule, Take 20 mg by mouth daily.   polyethylene glycol powder (GLYCOLAX/MIRALAX) 17 GM/SCOOP powder, Take 17 g by mouth daily.  Current Facility-Administered Medications (Other):    0.9 %  sodium chloride infusion *For reference purposes while preparing patient instructions.   Delete this med list prior to printing instructions for patient.*  STOP Cozaar (Losartan) Sunday, January 26,  STOP HTCZ (Hydrochlorothiazide) Sunday, January 26,  On the morning of your procedure, take Aspirin 81 mg and any morning medicines NOT listed above.  You may use sips of water.  5. Plan to go home the same day, you will only stay overnight if medically necessary. 6. You MUST have a responsible adult to drive you home. 7. An adult MUST be with you the first 24 hours after you arrive home. 8. Bring a current list of your medications, and the last time and date medication taken. 9. Bring ID and current insurance cards. 10.Please wear clothes that are easy to get on and off and wear slip-on shoes.  Thank you for allowing [place] to care for you!   -- [facility]    Follow-Up: At [facility], you and your health needs are our priority.  As part of our continuing mission to provide you with exceptional heart care, we have created designated Provider Care Teams.  These Care Teams include your primary Cardiologist  (physician) and Advanced Practice Providers (APPs -  Physician Assistants and Nurse Practitioners) who all work together to provide you with the care you need, when you need it.  We recommend signing up for the patient portal called "MyChart".  Sign up information is provided on this After Visit Summary.  MyChart is used to connect with patients for Virtual Visits (Telemedicine).  Patients are able to view lab/test results, encounter notes, upcoming appointments, etc.  Non-urgent messages can be sent to your provider as well.   To learn more about what you can do with MyChart, go to [email or web address].    Your next appointment:   2 week(s)  Provider:   Tereso Newcomer, PA-C         Other Instructions   1st Floor: - Lobby - Registration  - Pharmacy  - Lab - Cafe  2nd Floor: - PV Lab - Diagnostic Testing (echo, CT, nuclear med)  3rd Floor: - Vacant  4th Floor: - TCTS (cardiothoracic surgery) - AFib Clinic - Structural Heart Clinic - Vascular Surgery  - Vascular Ultrasound  5th Floor: - HeartCare Cardiology (general and  EP) - Clinical Pharmacy for coumadin, hypertension, lipid, weight-loss medications, and med management appointments    Valet parking services will be available as well.

## 2023-06-30 NOTE — Addendum Note (Signed)
Addended byAlben Spittle, Lorin Picket T on: 06/30/2023 01:04 PM   Modules accepted: Orders

## 2023-07-01 LAB — BASIC METABOLIC PANEL
BUN/Creatinine Ratio: 23 (ref 12–28)
BUN: 19 mg/dL (ref 8–27)
CO2: 24 mmol/L (ref 20–29)
Calcium: 9.7 mg/dL (ref 8.7–10.3)
Chloride: 97 mmol/L (ref 96–106)
Creatinine, Ser: 0.82 mg/dL (ref 0.57–1.00)
Glucose: 108 mg/dL — ABNORMAL HIGH (ref 70–99)
Potassium: 4 mmol/L (ref 3.5–5.2)
Sodium: 139 mmol/L (ref 134–144)
eGFR: 71 mL/min/{1.73_m2} (ref >59)

## 2023-07-01 LAB — CBC
Hematocrit: 45.6 % (ref 34.0–46.6)
Hemoglobin: 15.4 g/dL (ref 11.1–15.9)
MCH: 29.2 pg (ref 26.6–33.0)
MCHC: 33.8 g/dL (ref 31.5–35.7)
MCV: 87 fL (ref 79–97)
Platelets: 350 10*3/uL (ref 150–450)
RBC: 5.27 x10E6/uL (ref 3.77–5.28)
RDW: 12.1 % (ref 11.7–15.4)
WBC: 7.6 10*3/uL (ref 3.4–10.8)

## 2023-07-03 ENCOUNTER — Other Ambulatory Visit: Payer: Self-pay

## 2023-07-03 ENCOUNTER — Encounter (HOSPITAL_COMMUNITY): Payer: Self-pay | Admitting: Cardiology

## 2023-07-03 ENCOUNTER — Encounter (HOSPITAL_COMMUNITY)
Admission: AD | Disposition: A | Discharge status: Home / Self Care | Payer: Self-pay | Source: Ambulatory Visit | Attending: Cardiology

## 2023-07-03 ENCOUNTER — Ambulatory Visit (HOSPITAL_COMMUNITY)
Admission: AD | Admit: 2023-07-03 | Discharge: 2023-07-03 | Disposition: A | Discharge status: Home / Self Care | Payer: Medicare Other | Source: Ambulatory Visit | Attending: Cardiology | Admitting: Cardiology

## 2023-07-03 DIAGNOSIS — I25118 Atherosclerotic heart disease of native coronary artery with other forms of angina pectoris: Secondary | ICD-10-CM | POA: Insufficient documentation

## 2023-07-03 DIAGNOSIS — E785 Hyperlipidemia, unspecified: Secondary | ICD-10-CM | POA: Diagnosis not present

## 2023-07-03 DIAGNOSIS — R0609 Other forms of dyspnea: Secondary | ICD-10-CM | POA: Diagnosis not present

## 2023-07-03 DIAGNOSIS — Z79899 Other long term (current) drug therapy: Secondary | ICD-10-CM | POA: Diagnosis not present

## 2023-07-03 DIAGNOSIS — R0600 Dyspnea, unspecified: Secondary | ICD-10-CM | POA: Diagnosis not present

## 2023-07-03 DIAGNOSIS — R0602 Shortness of breath: Secondary | ICD-10-CM

## 2023-07-03 DIAGNOSIS — I10 Essential (primary) hypertension: Secondary | ICD-10-CM | POA: Insufficient documentation

## 2023-07-03 DIAGNOSIS — R079 Chest pain, unspecified: Secondary | ICD-10-CM

## 2023-07-03 DIAGNOSIS — I340 Nonrheumatic mitral (valve) insufficiency: Secondary | ICD-10-CM | POA: Insufficient documentation

## 2023-07-03 DIAGNOSIS — Z7982 Long term (current) use of aspirin: Secondary | ICD-10-CM | POA: Diagnosis not present

## 2023-07-03 DIAGNOSIS — I2089 Other forms of angina pectoris: Secondary | ICD-10-CM

## 2023-07-03 HISTORY — PX: RIGHT/LEFT HEART CATH AND CORONARY ANGIOGRAPHY: CATH118266

## 2023-07-03 LAB — POCT I-STAT EG7
Acid-Base Excess: 2 mmol/L (ref 0.0–2.0)
Acid-Base Excess: 2 mmol/L (ref 0.0–2.0)
Acid-Base Excess: 2 mmol/L (ref 0.0–2.0)
Bicarbonate: 27.2 mmol/L (ref 20.0–28.0)
Bicarbonate: 27.4 mmol/L (ref 20.0–28.0)
Bicarbonate: 27.8 mmol/L (ref 20.0–28.0)
Calcium, Ion: 1.18 mmol/L (ref 1.15–1.40)
Calcium, Ion: 1.19 mmol/L (ref 1.15–1.40)
Calcium, Ion: 1.19 mmol/L (ref 1.15–1.40)
HCT: 36 % (ref 36.0–46.0)
HCT: 36 % (ref 36.0–46.0)
HCT: 37 % (ref 36.0–46.0)
Hemoglobin: 12.2 g/dL (ref 12.0–15.0)
Hemoglobin: 12.2 g/dL (ref 12.0–15.0)
Hemoglobin: 12.6 g/dL (ref 12.0–15.0)
O2 Saturation: 62 %
O2 Saturation: 65 %
O2 Saturation: 65 %
Potassium: 3 mmol/L — ABNORMAL LOW (ref 3.5–5.1)
Potassium: 3 mmol/L — ABNORMAL LOW (ref 3.5–5.1)
Potassium: 3.1 mmol/L — ABNORMAL LOW (ref 3.5–5.1)
Sodium: 139 mmol/L (ref 135–145)
Sodium: 139 mmol/L (ref 135–145)
Sodium: 140 mmol/L (ref 135–145)
TCO2: 29 mmol/L (ref 22–32)
TCO2: 29 mmol/L (ref 22–32)
TCO2: 29 mmol/L (ref 22–32)
pCO2, Ven: 45.5 mm[Hg] (ref 44–60)
pCO2, Ven: 45.9 mm[Hg] (ref 44–60)
pCO2, Ven: 46.8 mm[Hg] (ref 44–60)
pH, Ven: 7.382 (ref 7.25–7.43)
pH, Ven: 7.384 (ref 7.25–7.43)
pH, Ven: 7.384 (ref 7.25–7.43)
pO2, Ven: 33 mm[Hg] (ref 32–45)
pO2, Ven: 35 mm[Hg] (ref 32–45)
pO2, Ven: 35 mm[Hg] (ref 32–45)

## 2023-07-03 LAB — POCT I-STAT 7, (LYTES, BLD GAS, ICA,H+H)
Acid-Base Excess: 1 mmol/L (ref 0.0–2.0)
Bicarbonate: 25.5 mmol/L (ref 20.0–28.0)
Calcium, Ion: 1.16 mmol/L (ref 1.15–1.40)
HCT: 36 % (ref 36.0–46.0)
Hemoglobin: 12.2 g/dL (ref 12.0–15.0)
O2 Saturation: 87 %
Potassium: 3 mmol/L — ABNORMAL LOW (ref 3.5–5.1)
Sodium: 139 mmol/L (ref 135–145)
TCO2: 27 mmol/L (ref 22–32)
pCO2 arterial: 41.1 mm[Hg] (ref 32–48)
pH, Arterial: 7.402 (ref 7.35–7.45)
pO2, Arterial: 53 mm[Hg] — ABNORMAL LOW (ref 83–108)

## 2023-07-03 SURGERY — RIGHT/LEFT HEART CATH AND CORONARY ANGIOGRAPHY
Anesthesia: LOCAL

## 2023-07-03 MED ORDER — HEPARIN (PORCINE) IN NACL 1000-0.9 UT/500ML-% IV SOLN
INTRAVENOUS | Status: DC | PRN
  Administered 2023-07-03 (×2): 500 mL

## 2023-07-03 MED ORDER — FENTANYL CITRATE (PF) 100 MCG/2ML IJ SOLN
INTRAMUSCULAR | Status: DC | PRN
  Administered 2023-07-03: 25 ug via INTRAVENOUS

## 2023-07-03 MED ORDER — LIDOCAINE HCL (PF) 1 % IJ SOLN
INTRAMUSCULAR | Status: DC | PRN
  Administered 2023-07-03: 5 mL via INTRADERMAL
  Administered 2023-07-03: 2 mL via INTRADERMAL

## 2023-07-03 MED ORDER — FENTANYL CITRATE (PF) 100 MCG/2ML IJ SOLN
INTRAMUSCULAR | Status: AC
  Filled 2023-07-03: qty 2

## 2023-07-03 MED ORDER — HEPARIN SODIUM (PORCINE) 1000 UNIT/ML IJ SOLN
INTRAMUSCULAR | Status: AC
  Filled 2023-07-03: qty 10

## 2023-07-03 MED ORDER — HEPARIN SODIUM (PORCINE) 1000 UNIT/ML IJ SOLN
INTRAMUSCULAR | Status: DC | PRN
  Administered 2023-07-03: 3000 [IU] via INTRAVENOUS

## 2023-07-03 MED ORDER — ONDANSETRON HCL 4 MG/2ML IJ SOLN: 4.0000 mg | Freq: Four times a day (QID) | INTRAMUSCULAR | Status: DC | PRN

## 2023-07-03 MED ORDER — SODIUM CHLORIDE 0.9% FLUSH: 3.0000 mL | INTRAVENOUS | Status: DC | PRN

## 2023-07-03 MED ORDER — MIDAZOLAM HCL 2 MG/2ML IJ SOLN
INTRAMUSCULAR | Status: DC | PRN
  Administered 2023-07-03: 2 mg via INTRAVENOUS

## 2023-07-03 MED ORDER — VERAPAMIL HCL 2.5 MG/ML IV SOLN
INTRAVENOUS | Status: AC
  Filled 2023-07-03: qty 2

## 2023-07-03 MED ORDER — LIDOCAINE HCL (PF) 1 % IJ SOLN
INTRAMUSCULAR | Status: AC
  Filled 2023-07-03: qty 30

## 2023-07-03 MED ORDER — SODIUM CHLORIDE 0.9 % IV SOLN: 250.0000 mL | INTRAVENOUS | Status: DC | PRN

## 2023-07-03 MED ORDER — SODIUM CHLORIDE 0.9 % IV SOLN: INTRAVENOUS | Status: DC

## 2023-07-03 MED ORDER — VERAPAMIL HCL 2.5 MG/ML IV SOLN
INTRAVENOUS | Status: DC | PRN
  Administered 2023-07-03: 3 mL via INTRA_ARTERIAL

## 2023-07-03 MED ORDER — MIDAZOLAM HCL 2 MG/2ML IJ SOLN
INTRAMUSCULAR | Status: AC
  Filled 2023-07-03: qty 2

## 2023-07-03 MED ORDER — ASPIRIN 81 MG PO CHEW: 81.0000 mg | CHEWABLE_TABLET | ORAL | Status: DC

## 2023-07-03 MED ORDER — ACETAMINOPHEN 325 MG PO TABS
650.0000 mg | ORAL_TABLET | ORAL | Status: DC | PRN
Start: 2023-07-03 — End: 2023-07-03

## 2023-07-03 SURGICAL SUPPLY — 11 items
CATH INFINITI AMBI 5FR TG (CATHETERS) IMPLANT
CATH SWAN GANZ 7F STRAIGHT (CATHETERS) IMPLANT
DEVICE RAD TR BAND REGULAR (VASCULAR PRODUCTS) IMPLANT
GLIDESHEATH SLEND A-KIT 6F 22G (SHEATH) IMPLANT
GLIDESHEATH SLENDER 7FR .021G (SHEATH) IMPLANT
GUIDEWIRE ANGLED .035X150CM (WIRE) IMPLANT
GUIDEWIRE INQWIRE 1.5J.035X260 (WIRE) IMPLANT
INQWIRE 1.5J .035X260CM (WIRE) ×1
KIT SINGLE USE MANIFOLD (KITS) IMPLANT
PACK CARDIAC CATHETERIZATION (CUSTOM PROCEDURE TRAY) ×1 IMPLANT
SET ATX-X65L (MISCELLANEOUS) IMPLANT

## 2023-07-03 NOTE — Interval H&P Note (Signed)
History and Physical Interval Note:  07/03/2023 9:07 AM  Allison Wells  has presented today for surgery, with the diagnosis of exertional anginal and shortness of breath.  The various methods of treatment have been discussed with the patient and family. After consideration of risks, benefits and other options for treatment, the patient has consented to  Procedure(s): RIGHT/LEFT HEART CATH AND CORONARY ANGIOGRAPHY (N/A) and possible coronary angioplasty as a surgical intervention.  The patient's history has been reviewed, patient examined, no change in status, stable for surgery.  I have reviewed the patient's chart and labs.  Questions were answered to the patient's satisfaction.   I have reviewed her chart, patient is on appropriate medical therapy with regard to symptoms of chest pain and also dyspnea, dyspnea appears to be more predominant, I reviewed her chart and will proceed with cardiac catheterization including right and left and make further decisions.  Yates Decamp

## 2023-07-03 NOTE — Discharge Instructions (Signed)
Radial Site Care  This sheet gives you information about how to care for yourself after your procedure. Your health care provider may also give you more specific instructions. If you have problems or questions, contact your health care provider. What can I expect after the procedure? After the procedure, it is common to have: Bruising and tenderness at the catheter insertion area. Follow these instructions at home: Medicines Take over-the-counter and prescription medicines only as told by your health care provider. Insertion site care Follow instructions from your health care provider about how to take care of your insertion site. Make sure you: Wash your hands with soap and water before you remove your bandage (dressing). If soap and water are not available, use hand sanitizer. May remove dressing in 24 hours. Check your insertion site every day for signs of infection. Check for: Redness, swelling, or pain. Fluid or blood. Pus or a bad smell. Warmth. Do no take baths, swim, or use a hot tub for 5 days. You may shower 24-48 hours after the procedure. Remove the dressing and gently wash the site with plain soap and water. Pat the area dry with a clean towel. Do not rub the site. That could cause bleeding. Do not apply powder or lotion to the site. Activity  For 24 hours after the procedure, or as directed by your health care provider: Do not flex or bend the affected arm. Do not push or pull heavy objects with the affected arm. Do not drive yourself home from the hospital or clinic. You may drive 24 hours after the procedure. Do not operate machinery or power tools. KEEP ARM ELEVATED THE REMAINDER OF THE DAY. Do not push, pull or lift anything that is heavier than 10 lb for 5 days. Ask your health care provider when it is okay to: Return to work or school. Resume usual physical activities or sports. Resume sexual activity. General instructions If the catheter site starts to  bleed, raise your arm and put firm pressure on the site. If the bleeding does not stop, get help right away. This is a medical emergency. DRINK PLENTY OF FLUIDS FOR THE NEXT 2-3 DAYS. No alcohol consumption for 24 hours after receiving sedation. If you went home on the same day as your procedure, a responsible adult should be with you for the first 24 hours after you arrive home. Keep all follow-up visits as told by your health care provider. This is important. Contact a health care provider if: You have a fever. You have redness, swelling, or yellow drainage around your insertion site. Get help right away if: You have unusual pain at the radial site. The catheter insertion area swells very fast. The insertion area is bleeding, and the bleeding does not stop when you hold steady pressure on the area. Your arm or hand becomes pale, cool, tingly, or numb. These symptoms may represent a serious problem that is an emergency. Do not wait to see if the symptoms will go away. Get medical help right away. Call your local emergency services (911 in the [place] Do not drive yourself to the hospital. Summary After the procedure, it is common to have bruising and tenderness at the site. Follow instructions from your health care provider about how to take care of your radial site wound. Check the wound every day for signs of infection.  This information is not intended to replace advice given to you by your health care provider. Make sure you discuss any questions you have with  your health care provider. Document Revised: 06/28/2017 Document Reviewed: 06/28/2017 Elsevier Patient Education  2020 [employer].

## 2023-07-12 ENCOUNTER — Ambulatory Visit (HOSPITAL_COMMUNITY): Payer: Medicare Other | Attending: Physician Assistant

## 2023-07-12 DIAGNOSIS — R002 Palpitations: Secondary | ICD-10-CM | POA: Insufficient documentation

## 2023-07-12 DIAGNOSIS — R0602 Shortness of breath: Secondary | ICD-10-CM | POA: Insufficient documentation

## 2023-07-12 DIAGNOSIS — I340 Nonrheumatic mitral (valve) insufficiency: Secondary | ICD-10-CM | POA: Insufficient documentation

## 2023-07-12 DIAGNOSIS — I2089 Other forms of angina pectoris: Secondary | ICD-10-CM | POA: Insufficient documentation

## 2023-07-12 LAB — ECHOCARDIOGRAM COMPLETE
Area-P 1/2: 2.66 cm2
S' Lateral: 2.4 cm

## 2023-07-13 ENCOUNTER — Encounter: Payer: Self-pay | Admitting: Physician Assistant

## 2023-07-13 ENCOUNTER — Ambulatory Visit (HOSPITAL_COMMUNITY): Payer: Medicare Other

## 2023-07-13 DIAGNOSIS — I5030 Unspecified diastolic (congestive) heart failure: Secondary | ICD-10-CM

## 2023-07-13 HISTORY — DX: Unspecified diastolic (congestive) heart failure: I50.30

## 2023-07-13 MED ORDER — SPIRONOLACTONE 25 MG PO TABS: 12.5000 mg | ORAL_TABLET | Freq: Every day | ORAL | 3 refills | Status: DC

## 2023-07-13 NOTE — Telephone Encounter (Addendum)
 The patient has been notified of the result and verbalized understanding.  All questions (if any) were answered. Casimira Sutphin Chauvigne, RN 07/13/2023 4:41 PM       ----- Message from Glendia Ferrier sent at 07/13/2023  9:01 AM EST ----- Results sent to [facility] via [facility]. See MyChart comments below. I will send a copy to PCP as FYI. PLAN:  -Start Spironolactone  12.5 mg once daily -BMET weekly x 2 (can get 1st at follow up OV next week)  Ms. Bumpass  Your echocardiogram shows normal ejection fraction (heart function). There is mild stiffness during relaxation (diastolic dysfunction). There are no significant valve issues. Your cardiac catheterization did not show any blockages. But the filling pressures on your cardiac catheterization were somewhat elevated. This can be caused by diastolic dysfunction and since we are seeing that on your echocardiogram, I think we should make some medication changes to help with your symptoms. There are 2 that can be helpful. You already take hydrochlorothiazide . Your potassium was low on lab work in the hospital. Spironolactone  is another diuretic that will protect you from losing potassium. It is a good medication to help with fluid as well. The other medication is called an SGLT2 inhibitor (Jardiance  or Farxiga). These are very good drugs to help with fluid and provide significant benefit. I would like to start the Spironolactone  12.5 mg once daily now. We can see how you are doing at follow up and decide if Jardiance  or Farxiga are needed.  Glendia Ferrier, PA-C

## 2023-07-17 NOTE — Progress Notes (Signed)
Cardiology Office Note:    Date:  07/18/2023  ID:  Antonieta Loveless, DOB 02-27-1941, MRN [id number] PCP: Kaleen Mask, MD  [facility] Providers Cardiologist:  Yates Decamp, MD       Patient Profile:      Shortness of breath Myoview 07/21/2015: EF 75, no ischemia, low risk CCTA 11/21/2016: CAC score 0, minimal nonobstructive CAD CPET 01/20/2017: Normal functional capacity PFTs 2018: Normal TTE 09/27/2021: EF 70-75, no RWMA, mild LVH, normal RVSF, mild MR, mild AI, AV sclerosis [facility] 07/03/23: mLAD mild ectasia; LVEDP 22, mean PA 21, PVR 2.06 WU, mean PCWP 11 TTE 07/12/23: TTE 07/12/2023: EF 65-70, no RWMA, GR 1 DD, GLS -19, normal RVSF, normal PASP, RVSP 24, mild LAE, trivial MR, trivial AI, RAP 3 (HFpEF) heart failure with preserved ejection fraction  Palpitations Monitor 07/2020: First-degree block, 10 SVT runs, occasional PACs and PVCs Mild mitral regurgitation Hypertension Hyperlipidemia Breast CA GERD          Allison Wells is a 83 y.o. female who returns for follow up of dyspnea and (HFpEF) heart failure with preserved ejection fraction. She was last seen 06/30/23 with symptoms of dyspnea on exertion and associated chest discomfort. Right and left cardiac catheterization was arranged. This demonstrated mild ectasia in the mid LAD but no significant disease. LVEDP was mildly elevated. TTE was obtained and showed EF 65-70, mild diastolic dysfunction. Given her symptoms of dyspnea on exertion and elevated LVEDP, I started her on Spironolactone.   Discussed the use of AI scribe software for clinical note transcription with the patient, who gave verbal consent to proceed.  History of Present Illness   She experiences shortness of breath, which is sometimes exacerbated by physical activity but improves with rest. There is no chest discomfort or need for nitroglycerin, and no episodes of syncope. Occasional leg swelling improves with elevation, and there is no sudden weight gain.  Her palpitations have improved with the increase in Propranolol, though they still occur occasionally. She describes them as brief and manageable with deep breathing and relaxation.  She recently started spironolactone 2 days ago but has not noticed a significant difference in her symptoms yet. She lives alone and is independent in her care. She enjoys a glass of wine with dinner and is considering incorporating regular walking into her routine to improve her overall health.      ROS-See HPI    Studies Reviewed:          Risk Assessment/Calculations:             Physical Exam:   VS:  BP 130/68   Pulse 68   Ht 5\' 2"  (1.575 m)   Wt 150 lb 12.8 oz (68.4 kg)   SpO2 97%   BMI 27.58 kg/m    Wt Readings from Last 3 Encounters:  07/18/23 150 lb 12.8 oz (68.4 kg)  07/03/23 148 lb (67.1 kg)  06/30/23 150 lb (68 kg)    Constitutional:      Appearance: Healthy appearance. Not in distress.  Neck:     Vascular: JVD normal.  Pulmonary:     Breath sounds: Normal breath sounds. No wheezing. No rales.  Cardiovascular:     Normal rate. Regular rhythm.     Murmurs: There is no murmur.     Comments: R wrist w/o hematoma Edema:    Peripheral edema absent.  Abdominal:     Palpations: Abdomen is soft.        Assessment and  Plan:   Assessment & Plan Chronic heart failure with preserved ejection fraction [facility]) Her cardiac catheterization showed no CAD. She did have mildly elevated LVEDP. She had mild diastolic dysfunction on recent echocardiogram. Her shortness of breath is likely related to HFpEF. She is NYHA II. Volume status is stable. We discussed the pathophysiology of (HFpEF) heart failure with preserved ejection fraction today and the role for diuretics, SGLT2 inhibitors. We discussed limiting sodium in her diet. She just started on Spironolactone 2 days ago. -Continue Spironolactone 12.5 mg once daily, hydrochlorothiazide 25 mg once daily  -BMET 07/20/23 and 07/27/23 -If she does not  have significant improvement in her shortness of breath, will start SGLT2i -Follow up 3 mos Nonrheumatic mitral valve regurgitation Trivial MR on recent echocardiogram. Mixed hyperlipidemia LDL 108 on labs in 09/2022 (per chart review). She had no CAD on her cath. Her LDL is acceptable for now. Continue Pravastatin 20 mg once daily, Zetia 10 mg once daily. Palpitations Her palpitations have improved but are still present. She had brief SV runs and PACs/PVCs on monitor in 2022.  -Increase Propranolol to 20 mg in AM and 10 mg in PM -If she cannot tolerate this, will reduce back to 10 mg twice daily -If palpitations continue, will get Zio monitor x 14 days Essential hypertension BP controlled. Continue Amlodipine 5 mg once daily, hydrochlorothiazide 25 mg once daily, Losartan 100 mg once daily.      Dispo:  Return in about 3 months (around 10/15/2023) for Routine Follow Up, w/ Dr. Jacinto Halim, or Tereso Newcomer, PA-C.  Signed, Tereso Newcomer, PA-C

## 2023-07-18 ENCOUNTER — Other Ambulatory Visit: Payer: Self-pay | Admitting: *Deleted

## 2023-07-18 ENCOUNTER — Ambulatory Visit: Payer: Medicare Other | Attending: Physician Assistant | Admitting: Physician Assistant

## 2023-07-18 ENCOUNTER — Encounter: Payer: Self-pay | Admitting: Physician Assistant

## 2023-07-18 VITALS — BP 130/68 | HR 68 | Ht 62.0 in | Wt 150.8 lb

## 2023-07-18 DIAGNOSIS — I5032 Chronic diastolic (congestive) heart failure: Secondary | ICD-10-CM

## 2023-07-18 DIAGNOSIS — I340 Nonrheumatic mitral (valve) insufficiency: Secondary | ICD-10-CM | POA: Insufficient documentation

## 2023-07-18 DIAGNOSIS — I10 Essential (primary) hypertension: Secondary | ICD-10-CM | POA: Insufficient documentation

## 2023-07-18 DIAGNOSIS — R002 Palpitations: Secondary | ICD-10-CM | POA: Diagnosis not present

## 2023-07-18 DIAGNOSIS — E782 Mixed hyperlipidemia: Secondary | ICD-10-CM

## 2023-07-18 MED ORDER — PROPRANOLOL HCL 10 MG PO TABS: ORAL_TABLET | ORAL | 3 refills | Status: AC

## 2023-07-18 NOTE — Patient Instructions (Addendum)
Medication Instructions:  Your physician has recommended you make the following change in your medication:   INCREASE the Propranolol to 10 mg taking 2 in the morning and 1 in the pm  (If you get dizzy on this, please switch back to 1 tablet twice a day)  *If you need a refill on your cardiac medications before your next appointment, please call your pharmacy*   Lab Work: THURSDAY, 07/20/23:  BMET  THURSDAY 07/27/23:  BMET  If you have labs (blood work) drawn today and your tests are completely normal, you will receive your results only by: MyChart Message (if you have MyChart) OR A paper copy in the mail If you have any lab test that is abnormal or we need to change your treatment, we will call you to review the results.   Testing/Procedures: None ordered   Follow-Up: At [facility], you and your health needs are our priority.  As part of our continuing mission to provide you with exceptional heart care, we have created designated Provider Care Teams.  These Care Teams include your primary Cardiologist (physician) and Advanced Practice Providers (APPs -  Physician Assistants and Nurse Practitioners) who all work together to provide you with the care you need, when you need it.  We recommend signing up for the patient portal called "MyChart".  Sign up information is provided on this After Visit Summary.  MyChart is used to connect with patients for Virtual Visits (Telemedicine).  Patients are able to view lab/test results, encounter notes, upcoming appointments, etc.  Non-urgent messages can be sent to your provider as well.   To learn more about what you can do with MyChart, go to [email or web address].    Your next appointment:   3 month(s)  Provider:   Dr. Jacinto Halim  or Tereso Newcomer, PA-C         Other Instructions  If your palpitations continue, please let me know  Let me know how your shortness of breath is in 2 weeks    1st Floor: - Lobby - Registration  -  Pharmacy  - Lab - Cafe  2nd Floor: - PV Lab - Diagnostic Testing (echo, CT, nuclear med)  3rd Floor: - Vacant  4th Floor: - TCTS (cardiothoracic surgery) - AFib Clinic - Structural Heart Clinic - Vascular Surgery  - Vascular Ultrasound  5th Floor: - HeartCare Cardiology (general and EP) - Clinical Pharmacy for coumadin, hypertension, lipid, weight-loss medications, and med management appointments    Valet parking services will be available as well.

## 2023-07-18 NOTE — Assessment & Plan Note (Addendum)
Her cardiac catheterization showed no CAD. She did have mildly elevated LVEDP. She had mild diastolic dysfunction on recent echocardiogram. Her shortness of breath is likely related to HFpEF. She is NYHA II. Volume status is stable. We discussed the pathophysiology of (HFpEF) heart failure with preserved ejection fraction today and the role for diuretics, SGLT2 inhibitors. We discussed limiting sodium in her diet. She just started on Spironolactone 2 days ago. -Continue Spironolactone 12.5 mg once daily, hydrochlorothiazide 25 mg once daily  -BMET 07/20/23 and 07/27/23 -If she does not have significant improvement in her shortness of breath, will start SGLT2i -Follow up 3 mos

## 2023-07-18 NOTE — Assessment & Plan Note (Signed)
LDL 108 on labs in 09/2022 (per chart review). She had no CAD on her cath. Her LDL is acceptable for now. Continue Pravastatin 20 mg once daily, Zetia 10 mg once daily.

## 2023-07-18 NOTE — Assessment & Plan Note (Signed)
Trivial MR on recent echocardiogram.

## 2023-07-18 NOTE — Assessment & Plan Note (Signed)
Her palpitations have improved but are still present. She had brief SV runs and PACs/PVCs on monitor in 2022.  -Increase Propranolol to 20 mg in AM and 10 mg in PM -If she cannot tolerate this, will reduce back to 10 mg twice daily -If palpitations continue, will get Zio monitor x 14 days

## 2023-07-18 NOTE — Assessment & Plan Note (Signed)
BP controlled. Continue Amlodipine 5 mg once daily, hydrochlorothiazide 25 mg once daily, Losartan 100 mg once daily.

## 2023-07-21 LAB — BASIC METABOLIC PANEL
BUN/Creatinine Ratio: 19 (ref 12–28)
BUN/Creatinine Ratio: 20 (ref 12–28)
BUN: 16 mg/dL (ref 8–27)
BUN: 16 mg/dL (ref 8–27)
CO2: 22 mmol/L (ref 20–29)
CO2: 23 mmol/L (ref 20–29)
Calcium: 9.7 mg/dL (ref 8.7–10.3)
Calcium: 9.7 mg/dL (ref 8.7–10.3)
Chloride: 98 mmol/L (ref 96–106)
Chloride: 99 mmol/L (ref 96–106)
Creatinine, Ser: 0.81 mg/dL (ref 0.57–1.00)
Creatinine, Ser: 0.83 mg/dL (ref 0.57–1.00)
Glucose: 103 mg/dL — ABNORMAL HIGH (ref 70–99)
Glucose: 98 mg/dL (ref 70–99)
Potassium: 4.8 mmol/L (ref 3.5–5.2)
Potassium: 5 mmol/L (ref 3.5–5.2)
Sodium: 137 mmol/L (ref 134–144)
Sodium: 138 mmol/L (ref 134–144)
eGFR: 70 mL/min/{1.73_m2} (ref >59)
eGFR: 72 mL/min/{1.73_m2} (ref >59)

## 2023-07-28 ENCOUNTER — Other Ambulatory Visit: Payer: Self-pay | Admitting: Physician Assistant

## 2023-07-28 LAB — BASIC METABOLIC PANEL
BUN/Creatinine Ratio: 21 (ref 12–28)
BUN: 17 mg/dL (ref 8–27)
CO2: 25 mmol/L (ref 20–29)
Calcium: 9.7 mg/dL (ref 8.7–10.3)
Chloride: 92 mmol/L — ABNORMAL LOW (ref 96–106)
Creatinine, Ser: 0.81 mg/dL (ref 0.57–1.00)
Glucose: 76 mg/dL (ref 70–99)
Potassium: 4.5 mmol/L (ref 3.5–5.2)
Sodium: 130 mmol/L — ABNORMAL LOW (ref 134–144)
eGFR: 72 mL/min/{1.73_m2} (ref >59)

## 2023-07-31 ENCOUNTER — Telehealth: Payer: Self-pay

## 2023-07-31 DIAGNOSIS — E7849 Other hyperlipidemia: Secondary | ICD-10-CM

## 2023-07-31 DIAGNOSIS — I110 Hypertensive heart disease with heart failure: Secondary | ICD-10-CM

## 2023-07-31 MED ORDER — HYDROCHLOROTHIAZIDE 25 MG PO TABS: 12.5000 mg | ORAL_TABLET | Freq: Every day | ORAL | 3 refills | Status: AC

## 2023-07-31 NOTE — Telephone Encounter (Signed)
 The patient has been notified of the result and verbalized understanding.  All questions (if any) were answered. Cindi Carbon [place], RN 07/31/2023 2:26 PM   Placed order for lab work, and updated medication list.

## 2023-07-31 NOTE — Telephone Encounter (Signed)
-----   Message from Allison Wells sent at 07/31/2023  1:46 PM EST ----- Result note sent to Antonieta Loveless via [place]. See comments below. PLAN:  -Decrease hydrochlorothiazide to 12.5 mg daily -BMET 1 week  Ms. Scull  Your creatinine (kidney function) is normal.  Your sodium is somewhat low.  Your potassium is normal.  Diuretics like hydrochlorothiazide can sometimes reduce sodium. I would like to decrease your hydrochlorothiazide to 12.5 mg daily and repeat your labs again in 1 week to recheck your sodium. Allison Newcomer, PA-C    07/31/2023 1:44 PM

## 2023-08-01 ENCOUNTER — Other Ambulatory Visit: Payer: Self-pay | Admitting: Cardiovascular Disease

## 2023-08-08 NOTE — Telephone Encounter (Signed)
 Start Jardiance 10 mg once daily  Please provide samples if possible Arrange BMET 3 weeks. Tereso Newcomer, PA-C    08/08/2023 5:15 PM

## 2023-08-09 LAB — BASIC METABOLIC PANEL
BUN/Creatinine Ratio: 24 (ref 12–28)
BUN: 18 mg/dL (ref 8–27)
CO2: 22 mmol/L (ref 20–29)
Calcium: 9.6 mg/dL (ref 8.7–10.3)
Chloride: 97 mmol/L (ref 96–106)
Creatinine, Ser: 0.74 mg/dL (ref 0.57–1.00)
Glucose: 95 mg/dL (ref 70–99)
Potassium: 4.7 mmol/L (ref 3.5–5.2)
Sodium: 136 mmol/L (ref 134–144)
eGFR: 81 mL/min/{1.73_m2} (ref >59)

## 2023-08-09 NOTE — Telephone Encounter (Signed)
 Please provide Jardiance 10 mg once daily samples - at least 2 weeks worth. Send Rx in for Jardiance to her pharmacy. Arrange BMET 3 weeks after staring. Tereso Newcomer, PA-C    08/09/2023 4:31 PM

## 2023-08-09 NOTE — Telephone Encounter (Signed)
 Pt advised of the cost for Jardiance.  She will call her insurance company to see if she has met her deductible or not, and if the cost would come down before she starts it.  She did want to ask, if there was something different?  She said you mentioned it in your note about something else as well?  She said her sob is not near as bad as it was, she still has some problems, but not like it was and didn't really want to pay the price if she could get around it.

## 2023-08-11 MED ORDER — EMPAGLIFLOZIN 10 MG PO TABS: 10.0000 mg | ORAL_TABLET | Freq: Every day | ORAL | Status: DC

## 2023-08-11 MED ORDER — EMPAGLIFLOZIN 10 MG PO TABS: 10.0000 mg | ORAL_TABLET | Freq: Every day | ORAL | 3 refills | Status: AC

## 2023-09-13 ENCOUNTER — Other Ambulatory Visit: Payer: Self-pay

## 2023-09-13 MED ORDER — PRAVASTATIN SODIUM 20 MG PO TABS: 20.0000 mg | ORAL_TABLET | Freq: Every day | ORAL | 3 refills | Status: DC

## 2023-10-05 ENCOUNTER — Other Ambulatory Visit: Payer: Self-pay

## 2023-10-05 DIAGNOSIS — Z79899 Other long term (current) drug therapy: Secondary | ICD-10-CM

## 2023-10-05 DIAGNOSIS — E782 Mixed hyperlipidemia: Secondary | ICD-10-CM

## 2023-10-05 DIAGNOSIS — E785 Hyperlipidemia, unspecified: Secondary | ICD-10-CM

## 2023-10-05 MED ORDER — EZETIMIBE 10 MG PO TABS: 10.0000 mg | ORAL_TABLET | Freq: Every day | ORAL | 2 refills | Status: DC

## 2023-10-16 ENCOUNTER — Ambulatory Visit: Payer: Medicare Other | Attending: Cardiology | Admitting: Cardiology

## 2023-10-16 ENCOUNTER — Encounter: Payer: Self-pay | Admitting: Cardiology

## 2023-10-16 VITALS — BP 122/60 | Resp 16 | Ht 62.0 in | Wt 147.2 lb

## 2023-10-16 DIAGNOSIS — E782 Mixed hyperlipidemia: Secondary | ICD-10-CM

## 2023-10-16 DIAGNOSIS — R002 Palpitations: Secondary | ICD-10-CM | POA: Diagnosis not present

## 2023-10-16 DIAGNOSIS — I10 Essential (primary) hypertension: Secondary | ICD-10-CM

## 2023-10-16 DIAGNOSIS — I5032 Chronic diastolic (congestive) heart failure: Secondary | ICD-10-CM

## 2023-10-16 NOTE — Patient Instructions (Signed)
 Medication Instructions:  Your physician recommends that you continue on your current medications as directed. Please refer to the Current Medication list given to you today.  *If you need a refill on your cardiac medications before your next appointment, please call your pharmacy*  Lab Work: Have lipid profile checked today at [facility] on the first floor If you have labs (blood work) drawn today and your tests are completely normal, you will receive your results only by: MyChart Message (if you have MyChart) OR A paper copy in the mail If you have any lab test that is abnormal or we need to change your treatment, we will call you to review the results.  Testing/Procedures: none  Follow-Up: At [facility], you and your health needs are our priority.  As part of our continuing mission to provide you with exceptional heart care, our providers are all part of one team.  This team includes your primary Cardiologist (physician) and Advanced Practice Providers or APPs (Physician Assistants and Nurse Practitioners) who all work together to provide you with the care you need, when you need it.  Your next appointment:   3 month(s)  Provider:   Knox Perl, MD    We recommend signing up for the patient portal called "MyChart".  Sign up information is provided on this After Visit Summary.  MyChart is used to connect with patients for Virtual Visits (Telemedicine).  Patients are able to view lab/test results, encounter notes, upcoming appointments, etc.  Non-urgent messages can be sent to your provider as well.   To learn more about what you can do with MyChart, go to [email or web address].   Other Instructions

## 2023-10-16 NOTE — Progress Notes (Signed)
 Cardiology Office Note:  .   Date:  10/16/2023  ID:  Allison Wells, DOB July 27, 1940, MRN [id number] PCP: Allison Chamorro, MD  [facility] Providers Cardiologist:  Knox Perl, MD   History of Present Illness: .   Allison Wells is a 83 y.o. Caucasian female patient with HFpEF, cardiac catheterization in January 2025 revealing mild ectasia in mid LAD, elevated EDP at 22, and normal right heart catheterization presents for 36-month follow-up.  She is new to me being previously followed by Riccardo Chamberlain, MD.  Discussed the use of AI scribe software for clinical note transcription with the patient, who gave verbal consent to proceed.  History of Present Illness Allison Wells is an 83 year old female who presents with chronic back pain and fatigue.  She has diastolic heart failure and is on amlodipine , hydrochlorothiazide , losartan , spironolactone , and Jardiance  since August 12, 2023. There is improvement in palpitations and shortness of breath, though some shortness of breath persists. Her beta blocker dose was reduced due to fatigue, and she currently takes propranolol  10 mg in the morning and at night.  She has high blood pressure and high cholesterol, managed with pravastatin  20 mg in the evening and azithromycin 10 mg once a day. No recent lab work except for a heart catheterization. She experiences persistent fatigue and low energy levels.  Labs   Lab Results  Component Value Date   CHOL 208 (H) 09/23/2022   HDL 74 09/23/2022   LDLCALC 108 (H) 09/23/2022   TRIG 151 (H) 09/23/2022   CHOLHDL 2.8 09/23/2022   Lab Results  Component Value Date   NA 136 08/08/2023   K 4.7 08/08/2023   CO2 22 08/08/2023   GLUCOSE 95 08/08/2023   BUN 18 08/08/2023   CREATININE 0.74 08/08/2023   CALCIUM  9.6 08/08/2023   EGFR 81 08/08/2023   GFRNONAA 82 04/23/2018      Latest Ref Rng & Units 08/08/2023   10:31 AM 07/28/2023    9:50 AM 07/20/2023   10:43 AM  BMP  Glucose 70 - 99 mg/dL  95  76  98   BUN 8 - 27 mg/dL 18  17  16    Creatinine 0.57 - 1.00 mg/dL 4.69  6.29  5.28   BUN/Creat Ratio 12 - 28 24  21  19    Sodium 134 - 144 mmol/L 136  130  137   Potassium 3.5 - 5.2 mmol/L 4.7  4.5  5.0   Chloride 96 - 106 mmol/L 97  92  98   CO2 20 - 29 mmol/L 22  25  23    Calcium  8.7 - 10.3 mg/dL 9.6  9.7  9.7       Latest Ref Rng & Units 07/03/2023    9:27 AM 07/03/2023    9:25 AM 07/03/2023    9:23 AM  CBC  Hemoglobin 12.0 - 15.0 g/dL 41.3  24.4  01.0   Hematocrit 36.0 - 46.0 % 36.0  36.0  37.0    Lab Results  Component Value Date   HGBA1C 5.9 (H) 09/23/2022    Lab Results  Component Value Date   TSH 4.490 09/23/2022    ROS  Review of Systems  Cardiovascular:  Positive for dyspnea on exertion and palpitations (stable). Negative for chest pain, leg swelling, orthopnea and syncope.    Physical Exam:   VS:  There were no vitals taken for this visit.   Wt Readings from Last 3 Encounters:  07/18/23 150 lb  12.8 oz (68.4 kg)  07/03/23 148 lb (67.1 kg)  06/30/23 150 lb (68 kg)    Physical Exam Neck:     Vascular: No carotid bruit or JVD.  Cardiovascular:     Rate and Rhythm: Normal rate and regular rhythm.     Pulses: Intact distal pulses.     Heart sounds: Normal heart sounds. No murmur heard.    No gallop.  Pulmonary:     Effort: Pulmonary effort is normal.     Breath sounds: Normal breath sounds.  Abdominal:     General: Bowel sounds are normal.     Palpations: Abdomen is soft.  Musculoskeletal:     Right lower leg: No edema.     Left lower leg: No edema.    Studies Reviewed: Aaron Aas    CARDIAC CATHETERIZATION 07/03/2023    Non-stenotic Prox LAD to Mid LAD ectasia.   LV end diastolic pressure is moderately elevated.   Right + Left Heart Catheterization 07/03/23: Hemodynamic data: RA 10/8, mean 6 mmHg RV 36/0, EDP 8 mmHg PA 39/9, mean 21 mmHg.  PVR 2.06 Wood units, normal. PW 12/11, mean 11 mmHg.  QP/QS 1.14 suggest mild left-to-right shunting not  hemodynamically significant. CO 4.27/CI 2.54 by Fick.   LVEDP 22 mmHg.  Moderately elevated EDP.  No pressure gradient across the aortic valve.  ECHOCARDIOGRAM COMPLETE 07/12/2023  1. Left ventricular ejection fraction, by estimation, is 65 to 70%. The left ventricle has normal function. The left ventricle has no regional wall motion abnormalities. Left ventricular diastolic parameters are consistent with Grade I diastolic dysfunction (impaired relaxation). The average left ventricular global longitudinal strain is -19.0 %. The global longitudinal strain is normal. 2. Right ventricular systolic function is normal. The right ventricular size is normal. There is normal pulmonary artery systolic pressure. The estimated right ventricular systolic pressure is 24.0 mmHg. 3. Left atrial size was mildly dilated.  EKG:         Medications and allergies    Allergies  Allergen Reactions   Rosuvastatin  Other (See Comments)    Pt reports causes back aches and joints to ache     Current Outpatient Medications:    amLODipine  (NORVASC ) 5 MG tablet, TAKE ONE TABLET BY MOUTH ONE TIME DAILY, Disp: 90 tablet, Rfl: 3   Cholecalciferol (VITAMIN D ) 2000 units CAPS, Take 1,000 Units by mouth daily., Disp: , Rfl:    cyanocobalamin 2000 MCG tablet, Take 1,000 mcg by mouth daily., Disp: , Rfl:    empagliflozin  (JARDIANCE ) 10 MG TABS tablet, Take 1 tablet (10 mg total) by mouth daily before breakfast., Disp: 90 tablet, Rfl: 3   empagliflozin  (JARDIANCE ) 10 MG TABS tablet, Take 1 tablet (10 mg total) by mouth daily before breakfast., Disp: , Rfl:    ezetimibe  (ZETIA ) 10 MG tablet, Take 1 tablet (10 mg total) by mouth daily., Disp: 90 tablet, Rfl: 2   hydrochlorothiazide  (HYDRODIURIL ) 25 MG tablet, Take 0.5 tablets (12.5 mg total) by mouth daily., Disp: 45 tablet, Rfl: 3   Loratadine 10 MG CAPS, Take 1 capsule by mouth daily., Disp: , Rfl:    losartan  (COZAAR ) 100 MG tablet, Take 1 tablet (100 mg total) by mouth at  bedtime., Disp: 90 tablet, Rfl: 3   nitroGLYCERIN  (NITROSTAT ) 0.4 MG SL tablet, Place 1 tablet (0.4 mg total) under the tongue every 5 (five) minutes as needed for chest pain., Disp: 25 tablet, Rfl: 3   omeprazole (PRILOSEC) 20 MG capsule, Take 20 mg by mouth daily., Disp: ,  Rfl:    polyethylene glycol powder (GLYCOLAX/MIRALAX) 17 GM/SCOOP powder, Take 17 g by mouth daily., Disp: , Rfl:    pravastatin  (PRAVACHOL ) 20 MG tablet, Take 1 tablet (20 mg total) by mouth daily., Disp: 90 tablet, Rfl: 3   propranolol  (INDERAL ) 10 MG tablet, TAKE 2 TABLETS BY MOUTH IN THE MORNING, TAKE 1 TABLET BY MOUTH IN THE AFTERNOON, Disp: 270 tablet, Rfl: 3   spironolactone  (ALDACTONE ) 25 MG tablet, Take 0.5 tablets (12.5 mg total) by mouth daily., Disp: 45 tablet, Rfl: 3   No orders of the defined types were placed in this encounter.    There are no discontinued medications.   ASSESSMENT AND PLAN: .      ICD-10-CM   1. Chronic heart failure with preserved ejection fraction (HCC)  I50.32     2. Essential hypertension  I10     3. Palpitations  R00.2       Assessment and Plan Assessment & Plan Chronic diastolic heart failure   She has chronic diastolic heart failure with preserved ejection fraction. Heart function is normal, but relaxation is impaired, leading to dyspnea and fatigue. Symptoms have improved with current therapy. Emphasize exercise and dietary modifications to enhance heart relaxation and prevent exacerbations. Reduce Jardiance  to 5 mg once daily due to mild increased frequency of UTI, and discontinue if UTI symptoms persist. Encourage enrollment in an exercise program, such as water aerobics or walking, to improve heart relaxation. Advise on dietary modifications to avoid high-salt foods to prevent heart failure exacerbations.  Hypertension   Hypertension is well-controlled on current medications. Continue current antihypertensive medications: amlodipine , hydrochlorothiazide , losartan , and  spironolactone .  Hyperlipidemia   Hyperlipidemia is managed with pravastatin  and Zetia . Cholesterol levels need re-evaluation as she has not been checked in a year. Order lipid profile today.  Urinary tract infection (UTI)   There is a mild increased frequency of UTI symptoms, possibly related to Jardiance  use. Reduce Jardiance  to 5 mg once daily and discontinue if UTI symptoms persist.  Back pain   She experiences chronic back pain with recent exacerbation. Ablation in 2024 provided relief, but symptoms have returned. Current management includes ibuprofen, Tylenol , and methocarbamol. Advise that Tylenol  is safe for use. Caution against regular use of ibuprofen due to potential increase in blood pressure and risk of heart failure. Recommend Aleve as a better alternative to Motrin for occasional use. Continue methocarbamol as needed for muscle relaxation.  Office visit in 3 months and if she remains stable on a as needed basis.     Signed,  Knox Perl, MD, [facility] 10/16/2023, 7:01 AM [facility] [address] [place], [place] [address] Phone: [phone]. Fax:  [phone]

## 2023-10-17 ENCOUNTER — Encounter: Payer: Self-pay | Admitting: Cardiology

## 2023-10-17 ENCOUNTER — Telehealth: Payer: Self-pay | Admitting: Cardiology

## 2023-10-17 DIAGNOSIS — E782 Mixed hyperlipidemia: Secondary | ICD-10-CM

## 2023-10-17 LAB — LIPID PANEL
Chol/HDL Ratio: 2.8 ratio (ref 0.0–4.4)
Cholesterol, Total: 201 mg/dL — ABNORMAL HIGH (ref 100–199)
HDL: 71 mg/dL (ref >39)
LDL Chol Calc (NIH): 108 mg/dL — ABNORMAL HIGH (ref 0–99)
Triglycerides: 126 mg/dL (ref 0–149)
VLDL Cholesterol Cal: 22 mg/dL (ref 5–40)

## 2023-10-17 NOTE — Progress Notes (Signed)
 Ask her if she is willing to try to increase Pravastatin  to 40 mg and continue Zetia  or change pravastatin  to Atorvastatin. She is intolerant to Rosuvastatin 

## 2023-10-17 NOTE — Telephone Encounter (Signed)
 Caller Tanis Fan) would like a call back regarding patient's note reading "She has high blood pressure and high cholesterol, managed with pravastatin  20 mg in the evening and azithromycin 10 mg once a day."  Caller noted they are not showing Azithromycin medication on patient's medication list.

## 2023-10-17 NOTE — Telephone Encounter (Signed)
 Allison Wells from [employer] was calling in to clarify Dr. Maida Sciara progress note from yesterday's office visit, stating the "pt has high cholesterol and is managed with pravastatin  20 mg in the evening and azithromycin 10 mg once daily."  Allison Wells wanted to clarify the "azithromycin" piece of this message, being the pt is not taking this medication and this does not come in 10 mg.   Informed Allison Wells that it looks like his dragon dictated azithromycin 10 mg once daily, when indeed he was referring to ezetimibe  10 mg by mouth once daily.    Allison Wells verbalized understanding and was appreciative for the clarification provided.  Allison Wells stated to me that's what she thought Dr. Berry Bristol intended to say in the office note, and was more than likely a dictation error.  Allison Wells had no further questions/issues.

## 2023-10-18 MED ORDER — EZETIMIBE 10 MG PO TABS: 10.0000 mg | ORAL_TABLET | Freq: Every evening | ORAL | 2 refills | Status: AC

## 2023-10-18 MED ORDER — PRAVASTATIN SODIUM 40 MG PO TABS: 40.0000 mg | ORAL_TABLET | Freq: Every day | ORAL | 2 refills | Status: DC

## 2023-10-18 NOTE — Telephone Encounter (Signed)
 Prava to 40 and add zetia  10 mg every evening and check lipids in 3 months. 90 day Rx with 2 refills

## 2023-10-26 MED ORDER — LOSARTAN POTASSIUM 100 MG PO TABS: 100.0000 mg | ORAL_TABLET | Freq: Every day | ORAL | 3 refills | Status: AC

## 2023-10-26 NOTE — Addendum Note (Signed)
 Addended by: Krisna Omar L on: 10/26/2023 04:14 PM   Modules accepted: Orders

## 2024-01-10 ENCOUNTER — Ambulatory Visit: Payer: Self-pay | Admitting: *Deleted

## 2024-01-10 LAB — LIPID PANEL
Chol/HDL Ratio: 3.1 ratio (ref 0.0–4.4)
Cholesterol, Total: 218 mg/dL — ABNORMAL HIGH (ref 100–199)
HDL: 70 mg/dL (ref >39)
LDL Chol Calc (NIH): 121 mg/dL — ABNORMAL HIGH (ref 0–99)
Triglycerides: 158 mg/dL — ABNORMAL HIGH (ref 0–149)
VLDL Cholesterol Cal: 27 mg/dL (ref 5–40)

## 2024-01-11 NOTE — Progress Notes (Signed)
 When we last checked her labs, LDL was > 100 so I increased Pravastatin  to 40 and added zetia  10. But no change in LDL and in fact has increased. I tryly believe either Simvastatin 40 with Zetia  or Lipitor 10 with zetia  will do the work. Either is fine, she can try Simva first if she asks what my preference is. 30 days x 3 refills

## 2024-01-15 NOTE — Progress Notes (Signed)
 Cardiology Office Note:  .   Date:  01/16/2024  ID:  Allison Wells, DOB 11-17-1940, MRN [id number] PCP: Loring Tanda Mae, MD  [facility] Providers Cardiologist:  Gordy Bergamo, MD   History of Present Illness: .   Allison Wells is a 83 y.o. Caucasian female patient with HFpEF, cardiac catheterization in January 2025 revealing mild ectasia in mid LAD, elevated EDP at 22, and normal right heart catheterization presents for 12-month follow-up of DOE and palpitations.  On her last office visit due to recurrent UTI, I decreased the dose of Jardiance to 5 mg and to discontinue this if frequency of UTI persist.  Also patient with pravastatin and Zetia lipids and persistently elevated LDL non-HDL cholesterol except for chronic back pain she has no specific complaints  Cardiac Studies relevent.    ECHOCARDIOGRAM COMPLETE 07/12/2023  Normal LVEF discussed that hyperdynamic LVEF at 65 to 70%, grade 1 diastolic dysfunction.  CARDIAC CATHETERIZATION 07/03/2023  Mild pulmonary hypertension with elevated EDP.  Mild ectasia in the mid LAD otherwise no significant CAD. Discussed the use of AI scribe software for clinical note transcription with the patient, who gave verbal consent to proceed.  History of Present Illness Allison Wells is an 83 year old female with chronic back issues and hyperlipidemia who presents with persistent back pain and concerns about cholesterol levels.  She experiences fatigue and shortness of breath, which she associates with reduced physical activity due to back pain. Her home environment requires frequent stair use, which she manages despite these symptoms.  She is concerned about increased cholesterol levels, which she attributes to dietary changes following her husband's passing and a recent refrigerator malfunction. She is on pravastatin but previously had muscle aches with rosuvastatin.  Her current medications include losartan 100 mg daily, propranolol, Jardiance  5 mg daily, and amlodipine 5 mg for blood pressure management.  Labs   Lab Results  Component Value Date   CHOL 218 (H) 01/09/2024   HDL 70 01/09/2024   LDLCALC 121 (H) 01/09/2024   TRIG 158 (H) 01/09/2024   CHOLHDL 3.1 01/09/2024   No results found for: LIPOA  Recent Labs    07/20/23 1043 07/28/23 0950 08/08/23 1031  NA 137 130* 136  K 5.0 4.5 4.7  CL 98 92* 97  CO2 23 25 22   GLUCOSE 98 76 95  BUN 16 17 18   CREATININE 0.83 0.81 0.74  CALCIUM 9.7 9.7 9.6    Lab Results  Component Value Date   ALT 16 09/23/2022   AST 18 09/23/2022   ALKPHOS 101 09/23/2022   BILITOT 0.6 09/23/2022      Latest Ref Rng & Units 07/03/2023    9:27 AM 07/03/2023    9:25 AM 07/03/2023    9:23 AM  CBC  Hemoglobin 12.0 - 15.0 g/dL 87.7  87.7  87.3   Hematocrit 36.0 - 46.0 % 36.0  36.0  37.0    Lab Results  Component Value Date   HGBA1C 5.9 (H) 09/23/2022    Lab Results  Component Value Date   TSH 4.490 09/23/2022    ROS  Review of Systems  Cardiovascular:  Positive for dyspnea on exertion. Negative for chest pain, leg swelling, orthopnea, palpitations and paroxysmal nocturnal dyspnea.   Physical Exam:   VS:  BP 120/68 (BP Location: Left Arm, Patient Position: Sitting, Cuff Size: Normal)   Pulse (!) 51   Resp 16   Ht 5' 2 (1.575 m)   Wt 148 lb  12.8 oz (67.5 kg)   SpO2 95%   BMI 27.22 kg/m    Wt Readings from Last 3 Encounters:  01/16/24 148 lb 12.8 oz (67.5 kg)  10/16/23 147 lb 3.2 oz (66.8 kg)  07/18/23 150 lb 12.8 oz (68.4 kg)    BP Readings from Last 3 Encounters:  01/16/24 120/68  10/16/23 122/60  07/18/23 130/68   Physical Exam Neck:     Vascular: No carotid bruit or JVD.  Cardiovascular:     Rate and Rhythm: Normal rate and regular rhythm.     Heart sounds: Normal heart sounds. No murmur heard.    No gallop.  Pulmonary:     Effort: Pulmonary effort is normal.     Breath sounds: Normal breath sounds.  Abdominal:     General: Bowel sounds are normal.      Palpations: Abdomen is soft.  Musculoskeletal:     Right lower leg: No edema.     Left lower leg: No edema.    EKG:         ASSESSMENT AND PLAN: .      ICD-10-CM   1. Chronic heart failure with preserved ejection fraction (HCC)  I50.32     2. Essential hypertension  I10     3. Mixed hyperlipidemia  E78.2 simvastatin  (ZOCOR ) 40 MG tablet     Assessment & Plan Heart failure with preserved ejection fraction Chronic heart failure with preserved ejection fraction. Shortness of breath is likely due to dietary factors such as high salt intake leading to fluid accumulation. Current medications are effectively managing the condition. - Continue current heart failure medications: losartan  100 mg daily, propranolol , Jardiance  5 mg daily, and amlodipine  5 mg for blood pressure. - Advise to avoid high-salt foods to prevent fluid accumulation and worsening of shortness of breath.  Hyperlipidemia Hyperlipidemia with recent increase in cholesterol levels. Pravastatin  is currently being used but is considered a weak statin. Previous intolerance to rosuvastatin  due to muscle pain. Decision to switch to simvastatin  40 mg once daily in the evening. - Discontinue pravastatin . - Initiate simvastatin  40 mg once daily in the evening. - Follow up with PCP to check cholesterol levels in 3-4 months.  Hypertension Hypertension is well-controlled with current medication regimen. Blood pressure is stable at 120/68 mmHg. - Continue current antihypertensive regimen: losartan  100 mg daily, propranolol , and amlodipine  5 mg.  Fatigue Fatigue likely related to lack of exercise and recent lifestyle changes. Heart function is stable, and there are no blockages. - Recommend starting with a stationary bicycle for exercise. - Encourage participation in water aerobics if feasible.  Shortness of breath Shortness of breath is expected with heart failure and may be exacerbated by dietary factors such as high salt  intake. Current medications are effectively managing the condition. - Advise to avoid high-salt foods to prevent fluid accumulation and worsening of shortness of breath.   Follow up: As needed  Signed,  Gordy Bergamo, MD, [facility] 01/16/2024, 6:30 PM [facility] [address] [place], [place] [address] Phone: [phone]. Fax:  [phone]

## 2024-01-16 ENCOUNTER — Ambulatory Visit: Attending: Cardiology | Admitting: Cardiology

## 2024-01-16 ENCOUNTER — Encounter: Payer: Self-pay | Admitting: Cardiology

## 2024-01-16 VITALS — BP 120/68 | HR 51 | Resp 16 | Ht 62.0 in | Wt 148.8 lb

## 2024-01-16 DIAGNOSIS — I5032 Chronic diastolic (congestive) heart failure: Secondary | ICD-10-CM | POA: Diagnosis not present

## 2024-01-16 DIAGNOSIS — E782 Mixed hyperlipidemia: Secondary | ICD-10-CM | POA: Diagnosis not present

## 2024-01-16 DIAGNOSIS — I10 Essential (primary) hypertension: Secondary | ICD-10-CM

## 2024-01-16 DIAGNOSIS — R002 Palpitations: Secondary | ICD-10-CM

## 2024-01-16 MED ORDER — SIMVASTATIN 40 MG PO TABS: 40.0000 mg | ORAL_TABLET | Freq: Every day | ORAL | 1 refills | Status: DC

## 2024-01-16 NOTE — Patient Instructions (Addendum)
 Medication Instructions:  Your physician has recommended you make the following change in your medication: Stop Pravastatin  Start Simvastatin  40 mg by mouth daily   *If you need a refill on your cardiac medications before your next appointment, please call your pharmacy*  Lab Work: none If you have labs (blood work) drawn today and your tests are completely normal, you will receive your results only by: MyChart Message (if you have MyChart) OR A paper copy in the mail If you have any lab test that is abnormal or we need to change your treatment, we will call you to review the results.  Testing/Procedures: none  Follow-Up: At [facility], you and your health needs are our priority.  As part of our continuing mission to provide you with exceptional heart care, our providers are all part of one team.  This team includes your primary Cardiologist (physician) and Advanced Practice Providers or APPs (Physician Assistants and Nurse Practitioners) who all work together to provide you with the care you need, when you need it.  Your next appointment:   As needed  Provider:   Gordy Bergamo, MD    We recommend signing up for the patient portal called MyChart.  Sign up information is provided on this After Visit Summary.  MyChart is used to connect with patients for Virtual Visits (Telemedicine).  Patients are able to view lab/test results, encounter notes, upcoming appointments, etc.  Non-urgent messages can be sent to your provider as well.   To learn more about what you can do with MyChart, go to [email or web address].   Other Instructions

## 2024-06-08 ENCOUNTER — Other Ambulatory Visit: Payer: Self-pay | Admitting: Physician Assistant

## 2024-07-02 ENCOUNTER — Other Ambulatory Visit: Payer: Self-pay | Admitting: Cardiology

## 2024-07-02 ENCOUNTER — Other Ambulatory Visit: Payer: Self-pay | Admitting: Cardiovascular Disease

## 2024-07-02 DIAGNOSIS — E782 Mixed hyperlipidemia: Secondary | ICD-10-CM

## 2024-07-05 NOTE — Telephone Encounter (Signed)
 Pt had a lipid panel done on 01/09/2024. Pt's medication was sent to pt's pharmacy as requested. Confirmation received.
